# Patient Record
Sex: Female | Born: 1965 | Race: White | Hispanic: No | Marital: Married | Smoking: Never smoker
Health system: Southern US, Community
[De-identification: ages and names within clinical notes are randomized; demographics above are authoritative.]

## PROBLEM LIST (undated history)

## (undated) DIAGNOSIS — N7011 Chronic salpingitis: Secondary | ICD-10-CM

## (undated) DIAGNOSIS — R7309 Other abnormal glucose: Secondary | ICD-10-CM

## (undated) HISTORY — DX: Chronic salpingitis: N70.11

## (undated) HISTORY — DX: Other abnormal glucose: R73.09

---

## 1998-09-24 ENCOUNTER — Other Ambulatory Visit: Admission: RE | Admit: 1998-09-24 | Discharge: 1998-09-24 | Payer: Self-pay | Admitting: Obstetrics and Gynecology

## 1999-10-14 ENCOUNTER — Other Ambulatory Visit: Admission: RE | Admit: 1999-10-14 | Discharge: 1999-10-14 | Payer: Self-pay | Admitting: Obstetrics and Gynecology

## 2000-11-10 ENCOUNTER — Other Ambulatory Visit: Admission: RE | Admit: 2000-11-10 | Discharge: 2000-11-10 | Payer: Self-pay | Admitting: Obstetrics and Gynecology

## 2001-07-21 HISTORY — PX: LAPAROSCOPIC CHOLECYSTECTOMY: SUR755

## 2001-11-09 ENCOUNTER — Other Ambulatory Visit: Admission: RE | Admit: 2001-11-09 | Discharge: 2001-11-09 | Payer: Self-pay | Admitting: Obstetrics and Gynecology

## 2002-11-15 ENCOUNTER — Other Ambulatory Visit: Admission: RE | Admit: 2002-11-15 | Discharge: 2002-11-15 | Payer: Self-pay | Admitting: Obstetrics and Gynecology

## 2003-11-17 ENCOUNTER — Other Ambulatory Visit: Admission: RE | Admit: 2003-11-17 | Discharge: 2003-11-17 | Payer: Self-pay | Admitting: Obstetrics and Gynecology

## 2004-07-21 HISTORY — PX: BREAST BIOPSY: SHX20

## 2004-09-05 ENCOUNTER — Ambulatory Visit: Payer: Self-pay | Admitting: Family Medicine

## 2004-12-25 ENCOUNTER — Ambulatory Visit (HOSPITAL_COMMUNITY): Admission: RE | Admit: 2004-12-25 | Discharge: 2004-12-25 | Payer: Self-pay | Admitting: Obstetrics and Gynecology

## 2004-12-25 ENCOUNTER — Other Ambulatory Visit: Admission: RE | Admit: 2004-12-25 | Discharge: 2004-12-25 | Payer: Self-pay | Admitting: Obstetrics and Gynecology

## 2005-01-01 ENCOUNTER — Encounter: Admission: RE | Admit: 2005-01-01 | Discharge: 2005-01-01 | Payer: Self-pay | Admitting: Obstetrics and Gynecology

## 2005-01-02 ENCOUNTER — Encounter (INDEPENDENT_AMBULATORY_CARE_PROVIDER_SITE_OTHER): Payer: Self-pay | Admitting: Specialist

## 2005-01-02 ENCOUNTER — Encounter: Admission: RE | Admit: 2005-01-02 | Discharge: 2005-01-02 | Payer: Self-pay | Admitting: Obstetrics and Gynecology

## 2006-02-03 ENCOUNTER — Encounter: Admission: RE | Admit: 2006-02-03 | Discharge: 2006-02-03 | Payer: Self-pay | Admitting: Obstetrics and Gynecology

## 2006-02-18 ENCOUNTER — Other Ambulatory Visit: Admission: RE | Admit: 2006-02-18 | Discharge: 2006-02-18 | Payer: Self-pay | Admitting: Obstetrics and Gynecology

## 2007-07-06 ENCOUNTER — Other Ambulatory Visit: Admission: RE | Admit: 2007-07-06 | Discharge: 2007-07-06 | Payer: Self-pay | Admitting: Obstetrics and Gynecology

## 2007-07-29 ENCOUNTER — Encounter: Admission: RE | Admit: 2007-07-29 | Discharge: 2007-07-29 | Payer: Self-pay | Admitting: Obstetrics and Gynecology

## 2008-07-27 ENCOUNTER — Other Ambulatory Visit: Admission: RE | Admit: 2008-07-27 | Discharge: 2008-07-27 | Payer: Self-pay | Admitting: Obstetrics & Gynecology

## 2009-05-30 ENCOUNTER — Encounter: Admission: RE | Admit: 2009-05-30 | Discharge: 2009-05-30 | Payer: Self-pay | Admitting: Obstetrics and Gynecology

## 2010-08-16 ENCOUNTER — Other Ambulatory Visit: Payer: Self-pay | Admitting: Obstetrics and Gynecology

## 2010-08-16 DIAGNOSIS — Z1239 Encounter for other screening for malignant neoplasm of breast: Secondary | ICD-10-CM

## 2010-09-09 ENCOUNTER — Ambulatory Visit: Payer: Self-pay

## 2010-09-17 ENCOUNTER — Ambulatory Visit
Admission: RE | Admit: 2010-09-17 | Discharge: 2010-09-17 | Disposition: A | Discharge status: Home / Self Care | Payer: BC Managed Care – PPO | Source: Ambulatory Visit | Attending: Obstetrics and Gynecology | Admitting: Obstetrics and Gynecology

## 2010-09-17 DIAGNOSIS — Z1239 Encounter for other screening for malignant neoplasm of breast: Secondary | ICD-10-CM

## 2013-09-13 ENCOUNTER — Encounter: Payer: Self-pay | Admitting: Certified Nurse Midwife

## 2013-09-20 ENCOUNTER — Ambulatory Visit: Payer: Self-pay | Admitting: Certified Nurse Midwife

## 2013-09-26 ENCOUNTER — Ambulatory Visit (INDEPENDENT_AMBULATORY_CARE_PROVIDER_SITE_OTHER): Payer: BC Managed Care – PPO | Admitting: Certified Nurse Midwife

## 2013-09-26 ENCOUNTER — Encounter: Payer: Self-pay | Admitting: Certified Nurse Midwife

## 2013-09-26 VITALS — BP 130/84 | HR 80 | Resp 20 | Ht 67.75 in | Wt 220.0 lb

## 2013-09-26 DIAGNOSIS — Z0000 Encounter for general adult medical examination without abnormal findings: Secondary | ICD-10-CM

## 2013-09-26 DIAGNOSIS — Z01419 Encounter for gynecological examination (general) (routine) without abnormal findings: Secondary | ICD-10-CM

## 2013-09-26 DIAGNOSIS — Z309 Encounter for contraceptive management, unspecified: Secondary | ICD-10-CM

## 2013-09-26 LAB — HEMOGLOBIN, FINGERSTICK: HEMOGLOBIN, FINGERSTICK: 12.8 g/dL (ref 12.0–16.0)

## 2013-09-26 LAB — POCT URINALYSIS DIPSTICK
BILIRUBIN UA: NEGATIVE
Blood, UA: NEGATIVE
Glucose, UA: NEGATIVE
KETONES UA: NEGATIVE
Leukocytes, UA: NEGATIVE
Nitrite, UA: NEGATIVE
PH UA: 5
Protein, UA: NEGATIVE
Urobilinogen, UA: NEGATIVE

## 2013-09-26 LAB — LIPID PANEL
CHOLESTEROL: 161 mg/dL (ref 0–200)
HDL: 63 mg/dL (ref >39)
LDL CALC: 68 mg/dL (ref 0–99)
Total CHOL/HDL Ratio: 2.6 Ratio
Triglycerides: 151 mg/dL — ABNORMAL HIGH (ref <150)
VLDL: 30 mg/dL (ref 0–40)

## 2013-09-26 MED ORDER — DESOGESTREL-ETHINYL ESTRADIOL 0.15-30 MG-MCG PO TABS: 1.0000 | ORAL_TABLET | Freq: Every day | ORAL | Status: DC

## 2013-09-26 NOTE — Progress Notes (Signed)
48 y.o. G70P2002 Married Caucasian Fe here for annual exam. Periods normal, no issues. Contraception working well. No health issues this year. Employment change closer to home, working well. [employer] in college!  Patient's last menstrual period was 09/01/2013.          Sexually active: yes  The current method of family planning is oral estrogen/ progesterone contraceptive.    Exercising: no  exercise Smoker:  no  Health Maintenance: Pap:  09-15-12 neg HPV HR neg MMG:  09-17-10 neg Colonoscopy:  none BMD:   none TDaP:  2012 Labs: Poct urine-neg, Hgb-12.8 Self breast exam: done occ   reports that she has never smoked. She does not have any smokeless tobacco history on file. She reports that she does not drink alcohol or use illicit drugs.  Past Medical History  Diagnosis Date  . Hydrosalpinx     right    Past Surgical History  Procedure Laterality Date  . Laparoscopic cholecystectomy  2003    Current Outpatient Prescriptions  Medication Sig Dispense Refill  . desogestrel-ethinyl estradiol (APRI,EMOQUETTE,SOLIA) 0.15-30 MG-MCG tablet Take 1 tablet by mouth daily.      . Multiple Vitamins-Minerals (MULTIVITAMIN PO) Take by mouth daily.       No current facility-administered medications for this visit.    Family History  Problem Relation Age of Onset  . Hypertension Mother   . Kidney failure Mother     ROS:  Pertinent items are noted in HPI.  Otherwise, a comprehensive ROS was negative.  Exam:   BP 130/84  Pulse 80  Resp 20  Ht 5' 7.75" (1.721 m)  Wt 220 lb (99.791 kg)  BMI 33.69 kg/m2  LMP 09/01/2013 Height: 5' 7.75" (172.1 cm)  Ht Readings from Last 3 Encounters:  09/26/13 5' 7.75" (1.721 m)    Re check of BP 120/78 after exam General appearance: alert, cooperative and appears stated age Head: Normocephalic, without obvious abnormality, atraumatic Neck: no adenopathy, supple, symmetrical, trachea midline and thyroid normal to inspection and palpation and  non-palpable Lungs: clear to auscultation bilaterally Breasts: normal appearance, no masses or tenderness, No nipple retraction or dimpling, No nipple discharge or bleeding, No axillary or supraclavicular adenopathy Heart: regular rate and rhythm Abdomen: soft, non-tender; no masses,  no organomegaly Extremities: extremities normal, atraumatic, no cyanosis or edema Skin: Skin color, texture, turgor normal. No rashes or lesions Lymph nodes: Cervical, supraclavicular, and axillary nodes normal. No abnormal inguinal nodes palpated Neurologic: Grossly normal   Pelvic: External genitalia:  no lesions              Urethra:  normal appearing urethra with no masses, tenderness or lesions              Bartholin's and Skene's: normal                 Vagina: normal appearing vagina with normal color and discharge, no lesions              Cervix: normal, non tender              Pap taken: no Bimanual Exam:  Uterus:  normal size, contour, position, consistency, mobility, non-tender and anteflexed              Adnexa: normal adnexa and no mass, fullness, tenderness               Rectovaginal: Confirms               Anus:  normal  sphincter tone, no lesions  A:  Well Woman with normal exam  Contraception OCP desired  Fasting labs  P:   Reviewed health and wellness pertinent to exam  Rx Apri see order  Lab: Lipid panel, TSH  Pap smear as per guidelines   Mammogram yearly pap smear not taken today  counseled on breast self exam, mammography screening, adequate intake of calcium and vitamin D, diet, weight loss and exercise, colonoscopy recomended at age 48, will advise if desires this year.  return annually or prn  An After Visit Summary was printed and given to the patient.

## 2013-09-26 NOTE — Progress Notes (Signed)
Reviewed personally.  M. Suzanne Adiba Fargnoli, MD.  

## 2013-09-26 NOTE — Patient Instructions (Signed)

## 2013-09-27 ENCOUNTER — Telehealth: Payer: Self-pay

## 2013-09-27 LAB — TSH: TSH: 1.792 u[IU]/mL (ref 0.350–4.500)

## 2013-09-27 NOTE — Telephone Encounter (Signed)
Message copied by Eliezer BottomJOHNSON, Christon Gallaway J on Tue Sep 27, 2013 11:09 AM ------      Message from: Verner CholLEONARD, DEBORAH S      Created: Tue Sep 27, 2013  8:18 AM       Notify patient TSH normal and Lipid panel great Cholesterol 161 and HDL protective cholesterol 63, Hgb. 12.8 ------

## 2013-09-27 NOTE — Telephone Encounter (Signed)
Patient notified of results. See lab 

## 2013-09-27 NOTE — Telephone Encounter (Signed)
lmtcb

## 2014-05-22 ENCOUNTER — Encounter: Payer: Self-pay | Admitting: Certified Nurse Midwife

## 2014-09-05 ENCOUNTER — Other Ambulatory Visit: Payer: Self-pay | Admitting: Certified Nurse Midwife

## 2014-09-05 NOTE — Telephone Encounter (Signed)
Medication refill request: Apri  Last AEX:  09/26/13 with DL Next AEX: 1/61/093/15/16 with DL Last MMG (if hormonal medication request): 09/18/10 Bi-Rads 1: Negative Refill authorized: #[id number] rfs, please advise.

## 2014-09-05 NOTE — Telephone Encounter (Signed)
Ok to refill to appointment

## 2014-10-03 ENCOUNTER — Ambulatory Visit (INDEPENDENT_AMBULATORY_CARE_PROVIDER_SITE_OTHER): Payer: BLUE CROSS/BLUE SHIELD | Admitting: Certified Nurse Midwife

## 2014-10-03 ENCOUNTER — Ambulatory Visit
Admission: RE | Admit: 2014-10-03 | Discharge: 2014-10-03 | Disposition: A | Discharge status: Home / Self Care | Payer: BLUE CROSS/BLUE SHIELD | Source: Ambulatory Visit | Attending: Certified Nurse Midwife | Admitting: Certified Nurse Midwife

## 2014-10-03 ENCOUNTER — Encounter: Payer: Self-pay | Admitting: Certified Nurse Midwife

## 2014-10-03 ENCOUNTER — Other Ambulatory Visit: Payer: Self-pay | Admitting: Certified Nurse Midwife

## 2014-10-03 VITALS — BP 120/78 | HR 70 | Resp 16 | Ht 67.5 in | Wt 223.0 lb

## 2014-10-03 DIAGNOSIS — Z0000 Encounter for general adult medical examination without abnormal findings: Secondary | ICD-10-CM | POA: Diagnosis not present

## 2014-10-03 DIAGNOSIS — Z124 Encounter for screening for malignant neoplasm of cervix: Secondary | ICD-10-CM

## 2014-10-03 DIAGNOSIS — Z01419 Encounter for gynecological examination (general) (routine) without abnormal findings: Secondary | ICD-10-CM

## 2014-10-03 DIAGNOSIS — Z3041 Encounter for surveillance of contraceptive pills: Secondary | ICD-10-CM | POA: Diagnosis not present

## 2014-10-03 DIAGNOSIS — N63 Unspecified lump in breast: Secondary | ICD-10-CM

## 2014-10-03 DIAGNOSIS — Z1211 Encounter for screening for malignant neoplasm of colon: Secondary | ICD-10-CM

## 2014-10-03 DIAGNOSIS — N6320 Unspecified lump in the left breast, unspecified quadrant: Secondary | ICD-10-CM

## 2014-10-03 DIAGNOSIS — N6321 Unspecified lump in the left breast, upper outer quadrant: Secondary | ICD-10-CM

## 2014-10-03 LAB — CBC
HEMATOCRIT: 38 % (ref 36.0–46.0)
Hemoglobin: 11.8 g/dL — ABNORMAL LOW (ref 12.0–15.0)
MCH: 24.3 pg — ABNORMAL LOW (ref 26.0–34.0)
MCHC: 31.1 g/dL (ref 30.0–36.0)
MCV: 78.4 fL (ref 78.0–100.0)
MPV: 8.7 fL (ref 8.6–12.4)
PLATELETS: 458 10*3/uL — AB (ref 150–400)
RBC: 4.85 MIL/uL (ref 3.87–5.11)
RDW: 15 % (ref 11.5–15.5)
WBC: 10.8 10*3/uL — ABNORMAL HIGH (ref 4.0–10.5)

## 2014-10-03 LAB — IBC PANEL
%SAT: 9 % — AB (ref 20–55)
TIBC: 576 ug/dL — ABNORMAL HIGH (ref 250–470)
UIBC: 523 ug/dL — ABNORMAL HIGH (ref 125–400)

## 2014-10-03 LAB — IRON: Iron: 53 ug/dL (ref 42–145)

## 2014-10-03 LAB — FERRITIN: FERRITIN: 6 ng/mL — AB (ref 10–291)

## 2014-10-03 MED ORDER — NORETHIN ACE-ETH ESTRAD-FE 1-20 MG-MCG(24) PO TABS: 1.0000 | ORAL_TABLET | Freq: Every day | ORAL | Status: DC

## 2014-10-03 NOTE — Progress Notes (Signed)
49 y.o. G75P2002 Married  Caucasian Fe here for annual exam. Periods normal no issues, except for day 1 heavy and then only 2 days until completed. "Really wipes me out". Eating good diet with iron foods and drinks adequate water. Contraception working well, but aware she will need to decrease dosage this year and is fine with a change. Has just started last pack and will complete and then switch. Denies any hot flashes or mood changes. Still has not had her mammogram!!! Sees Dr. Sol Passer prn, last labs here. No other concerns today or health issues.  Patient's last menstrual period was 09/27/2014.          Sexually active: Yes.    The current method of family planning is OCP (estrogen/progesterone).    Exercising: No.  exercise Smoker:  no  Health Maintenance: Pap:  09-15-12 neg HPV HR neg MMG: 09-17-12 neg birads 1:neg Colonoscopy: none BMD:   none TDaP:  2012 Labs: Hgb-11.2 Self breast exam: done monthly   reports that she has never smoked. She does not have any smokeless tobacco history on file. She reports that she does not drink alcohol or use illicit drugs.  Past Medical History  Diagnosis Date  . Hydrosalpinx     right    Past Surgical History  Procedure Laterality Date  . Laparoscopic cholecystectomy  2003    Current Outpatient Prescriptions  Medication Sig Dispense Refill  . APRI 0.15-30 MG-MCG tablet TAKE 1 TABLET BY MOUTH DAILY. 84 tablet 0  . Multiple Vitamins-Minerals (MULTIVITAMIN PO) Take by mouth daily.     No current facility-administered medications for this visit.    Family History  Problem Relation Age of Onset  . Hypertension Mother   . Kidney failure Mother     ROS:  Pertinent items are noted in HPI.  Otherwise, a comprehensive ROS was negative.  Exam:   Ht 5' 7.5" (1.715 m)  Wt 223 lb (101.152 kg)  BMI 34.39 kg/m2  LMP 09/27/2014 Height: 5' 7.5" (171.5 cm) Ht Readings from Last 3 Encounters:  10/03/14 5' 7.5" (1.715 m)  09/26/13 5' 7.75" (1.721  m)    General appearance: alert, cooperative and appears stated age Head: Normocephalic, without obvious abnormality, atraumatic Neck: no adenopathy, supple, symmetrical, trachea midline and thyroid normal to inspection and palpation Lungs: clear to auscultation bilaterally Breasts: normal appearance, no masses or tenderness, No nipple retraction or dimpling, No nipple discharge or bleeding, No axillary or supraclavicular adenopathy, positive findings: left breast 1-2 cm mobile cystic feel mass at 2 pm 2 FB out from aerola non tender Heart: regular rate and rhythm Abdomen: soft, non-tender; no masses,  no organomegaly Extremities: extremities normal, atraumatic, no cyanosis or edema Skin: Skin color, texture, turgor normal. No rashes or lesions Lymph nodes: Cervical, supraclavicular, and axillary nodes normal. No abnormal inguinal nodes palpated Neurologic: Grossly normal   Pelvic: External genitalia:  no lesions              Urethra:  normal appearing urethra with no masses, tenderness or lesions              Bartholin's and Skene's: normal                 Vagina: normal appearing vagina with normal color and discharge, no lesions              Cervix: normal,non tender, no lesions              Pap taken: Yes.  Bimanual Exam:  Uterus:  normal size, contour, position, consistency, mobility, non-tender and mid position              Adnexa: normal adnexa and no mass, fullness, tenderness               Rectovaginal: Confirms               Anus:  normal sphincter tone, no lesions  Chaperone present: Yes  A:  Well Woman with normal exam  Contraception OCP will decrease dosage due to age  Perimenopausal with cycle change  Left breast mass  R/O anemia  Colonoscopy due  P:   Reviewed health and wellness pertinent to exam  Discussed OCP change and will need condom use during first month due to decrease in estrogen amount. Discussed 3 month adjustment period and to call if problems.  Rx  Loestrin 1/20 Fe see order  Discussed perimenopausal etiology expectations and bleeding profile. Given menstrual calendar with normal and abnormal parameters to advise if occurs. Questions addresssed.  Discussed finding and need for diagnostic mammogram and us for evaluation. Patient agreeable. Will schedule while patient is here today. Needs screening on right breast also.  Discussed HGB has decreased from her normal >12 and may be related to last heavy menses. Work on iron foods in diet.  Labs Iron, CBC,Ferritin , IBC  Discussed risks and benefits of colonoscopy, patient desires referral for scheduling. She will be called with referral information. Questions addressed.  Pap smear taken today with HPV reflex   counseled on breast self exam, mammography screening, menopause, adequate intake of calcium and vitamin D, diet and exercise  return annually or prn  An After Visit Summary was printed and given to the patient.

## 2014-10-03 NOTE — Patient Instructions (Signed)
EXERCISE AND DIET:  We recommended that you start or continue a regular exercise program for good health. Regular exercise means any activity that makes your heart beat faster and makes you sweat.  We recommend exercising at least 30 minutes per day at least 3 days a week, preferably 4 or 5.  We also recommend a diet low in fat and sugar.  Inactivity, poor dietary choices and obesity can cause diabetes, heart attack, stroke, and kidney damage, among others.    ALCOHOL AND SMOKING:  Women should limit their alcohol intake to no more than 7 drinks/beers/glasses of wine (combined, not each!) per week. Moderation of alcohol intake to this level decreases your risk of breast cancer and liver damage. And of course, no recreational drugs are part of a healthy lifestyle.  And absolutely no smoking or even second hand smoke. Most people know smoking can cause heart and lung diseases, but did you know it also contributes to weakening of your bones? Aging of your skin?  Yellowing of your teeth and nails?  CALCIUM AND VITAMIN D:  Adequate intake of calcium and Vitamin D are recommended.  The recommendations for exact amounts of these supplements seem to change often, but generally speaking 600 mg of calcium (either carbonate or citrate) and 800 units of Vitamin D per day seems prudent. Certain women may benefit from higher intake of Vitamin D.  If you are among these women, your doctor will have told you during your visit.    PAP SMEARS:  Pap smears, to check for cervical cancer or precancers,  have traditionally been done yearly, although recent scientific advances have shown that most women can have pap smears less often.  However, every woman still should have a physical exam from her gynecologist every year. It will include a breast check, inspection of the vulva and vagina to check for abnormal growths or skin changes, a visual exam of the cervix, and then an exam to evaluate the size and shape of the uterus and  ovaries.  And after 51 years of age, a rectal exam is indicated to check for rectal cancers. We will also provide age appropriate advice regarding health maintenance, like when you should have certain vaccines, screening for sexually transmitted diseases, bone density testing, colonoscopy, mammograms, etc.   MAMMOGRAMS:  All women over 40 years old should have a yearly mammogram. Many facilities now offer a "3D" mammogram, which may cost around $50 extra out of pocket. If possible,  we recommend you accept the option to have the 3D mammogram performed.  It both reduces the number of women who will be called back for extra views which then turn out to be normal, and it is better than the routine mammogram at detecting truly abnormal areas.    COLONOSCOPY:  Colonoscopy to screen for colon cancer is recommended for all women at age 50.  We know, you hate the idea of the prep.  We agree, BUT, having colon cancer and not knowing it is worse!!  Colon cancer so often starts as a polyp that can be seen and removed at colonscopy, which can quite literally save your life!  And if your first colonoscopy is normal and you have no family history of colon cancer, most women don't have to have it again for 10 years.  Once every ten years, you can do something that may end up saving your life, right?  We will be happy to help you get it scheduled when you are ready.    Be sure to check your insurance coverage so you understand how much it will cost.  It may be covered as a preventative service at no cost, but you should check your particular policy.     Perimenopause Perimenopause is the time when your body begins to move into the menopause (no menstrual period for 12 straight months). It is a natural process. Perimenopause can begin 2-8 years before the menopause and usually lasts for 1 year after the menopause. During this time, your ovaries may or may not produce an egg. The ovaries vary in their production of estrogen and  progesterone hormones each month. This can cause irregular menstrual periods, difficulty getting pregnant, vaginal bleeding between periods, and uncomfortable symptoms. CAUSES  Irregular production of the ovarian hormones, estrogen and progesterone, and not ovulating every month.  Other causes include:  Tumor of the pituitary gland in the brain.  Medical disease that affects the ovaries.  Radiation treatment.  Chemotherapy.  Unknown causes.  Heavy smoking and excessive alcohol intake can bring on perimenopause sooner. SIGNS AND SYMPTOMS   Hot flashes.  Night sweats.  Irregular menstrual periods.  Decreased sex drive.  Vaginal dryness.  Headaches.  Mood swings.  Depression.  Memory problems.  Irritability.  Tiredness.  Weight gain.  Trouble getting pregnant.  The beginning of losing bone cells (osteoporosis).  The beginning of hardening of the arteries (atherosclerosis). DIAGNOSIS  Your health care provider will make a diagnosis by analyzing your age, menstrual history, and symptoms. He or she will do a physical exam and note any changes in your body, especially your female organs. Female hormone tests may or may not be helpful depending on the amount of female hormones you produce and when you produce them. However, other hormone tests may be helpful to rule out other problems. TREATMENT  In some cases, no treatment is needed. The decision on whether treatment is necessary during the perimenopause should be made by you and your health care provider based on how the symptoms are affecting you and your lifestyle. Various treatments are available, such as:  Treating individual symptoms with a specific medicine for that symptom.  Herbal medicines that can help specific symptoms.  Counseling.  Group therapy. HOME CARE INSTRUCTIONS   Keep track of your menstrual periods (when they occur, how heavy they are, how long between periods, and how long they last) as  well as your symptoms and when they started.  Only take over-the-counter or prescription medicines as directed by your health care provider.  Sleep and rest.  Exercise.  Eat a diet that contains calcium (good for your bones) and soy (acts like the estrogen hormone).  Do not smoke.  Avoid alcoholic beverages.  Take vitamin supplements as recommended by your health care provider. Taking vitamin E may help in certain cases.  Take calcium and vitamin D supplements to help prevent bone loss.  Group therapy is sometimes helpful.  Acupuncture may help in some cases. SEEK MEDICAL CARE IF:   You have questions about any symptoms you are having.  You need a referral to a specialist (gynecologist, psychiatrist, or psychologist). SEEK IMMEDIATE MEDICAL CARE IF:   You have vaginal bleeding.  Your period lasts longer than 8 days.  Your periods are recurring sooner than 21 days.  You have bleeding after intercourse.  You have severe depression.  You have pain when you urinate.  You have severe headaches.  You have vision problems. Document Released: 08/14/2004 Document Revised: 04/27/2013 Document Reviewed: 02/03/2013 [employer]   Patient Information 2015 [employer]. This information is not intended to replace advice given to you by your health care provider. Make sure you discuss any questions you have with your health care provider.  

## 2014-10-03 NOTE — Progress Notes (Signed)
Late entry. Patient scheduled for bilateral diagnostic mammogram [facility] while in the office. Appointment scheduled for today 10/03/2014 at 3:30pm. Patient is agreeable to date and time.

## 2014-10-04 ENCOUNTER — Other Ambulatory Visit: Payer: Self-pay | Admitting: Certified Nurse Midwife

## 2014-10-04 ENCOUNTER — Telehealth: Payer: Self-pay

## 2014-10-04 DIAGNOSIS — D649 Anemia, unspecified: Secondary | ICD-10-CM

## 2014-10-04 LAB — IPS PAP TEST WITH REFLEX TO HPV

## 2014-10-04 LAB — HEMOGLOBIN, FINGERSTICK: Hemoglobin, fingerstick: 11.2 g/dL — ABNORMAL LOW (ref 12.0–16.0)

## 2014-10-04 NOTE — Telephone Encounter (Signed)
Left detailed message per DPR. Hold to see if patient schedules appt

## 2014-10-04 NOTE — Progress Notes (Signed)
Reviewed personally.  M. Suzanne Joziah Dollins, MD.  

## 2014-10-04 NOTE — Telephone Encounter (Signed)
-----   Message from Verner Choleborah S Leonard, CNM sent at 10/04/2014  7:40 AM EDT ----- Notify patient that CBC showed borderline low hemoglobin with increase in platelet number which occurs with anemia Ferritin level is  Low at 6 normal is 10-291 Iron saturation level is low at 9 normal is 20-55 She has borderline anemia. She needs to start on OTC Slow release iron one daily at hs with Vitamin C source for better absorption. Also a tablespoon of Blackstrap molasses daily is a great iron source. Recheck levels in one month, please schedule order in

## 2014-10-16 ENCOUNTER — Telehealth: Payer: Self-pay | Admitting: Emergency Medicine

## 2014-10-16 NOTE — Telephone Encounter (Signed)
Spoke with patient and message from Deborah S. Leonard CNM given.  She isVerner Tyler agreeable to recheck of breast and the appointment is scheduled for 12/28/14 at 0915. Patient states she is feeling less fatigued with new iron rx and would like to wait until this time to have iron levels rechecked with Debra Choleborah S. Leonard CNM. Patient states she lives in [place] and doesn't want to drive back and fourth to [place] if not necessary. Added repeat blood work to appointment notes. I advised I would notify Debra Choleborah S. Leonard CNM of her preferences.   Okay as scheduled?

## 2014-10-16 NOTE — Telephone Encounter (Signed)
Notes Recorded by Verner Choleborah S Leonard, CNM on 10/03/2014 at 5:18 PM Notify patient that reviewed mammogram and US results and fibroglandular tissue noted. Would like to recheck breast in 2 months. Continue SBE and report if she notes any other concerns. Repeat mammogram in one year.  Birads 1 negative Density C needs 3 D yearly

## 2014-10-16 NOTE — Telephone Encounter (Signed)
-----   Message from Jerene BearsMary S Miller, MD sent at 10/05/2014  3:05 PM EDT ----- Out of mmg hold.  Agreed.

## 2014-10-17 NOTE — Telephone Encounter (Signed)
Noted, will close encounter. 

## 2014-10-17 NOTE — Telephone Encounter (Signed)
Pt scheduled  

## 2014-10-17 NOTE — Telephone Encounter (Signed)
Yes ok to do at same visit

## 2014-12-28 ENCOUNTER — Encounter: Payer: Self-pay | Admitting: Certified Nurse Midwife

## 2014-12-28 ENCOUNTER — Ambulatory Visit (INDEPENDENT_AMBULATORY_CARE_PROVIDER_SITE_OTHER): Payer: BLUE CROSS/BLUE SHIELD | Admitting: Certified Nurse Midwife

## 2014-12-28 VITALS — BP 124/82 | HR 72 | Resp 20 | Ht 67.5 in | Wt 230.0 lb

## 2014-12-28 DIAGNOSIS — N644 Mastodynia: Secondary | ICD-10-CM

## 2014-12-28 DIAGNOSIS — D649 Anemia, unspecified: Secondary | ICD-10-CM

## 2014-12-28 LAB — IBC PANEL
%SAT: 8 % — AB (ref 20–55)
TIBC: 543 ug/dL — ABNORMAL HIGH (ref 250–470)
UIBC: 502 ug/dL — ABNORMAL HIGH (ref 125–400)

## 2014-12-28 LAB — CBC
HCT: 38.6 % (ref 36.0–46.0)
Hemoglobin: 12.4 g/dL (ref 12.0–15.0)
MCH: 26.1 pg (ref 26.0–34.0)
MCHC: 32.1 g/dL (ref 30.0–36.0)
MCV: 81.3 fL (ref 78.0–100.0)
MPV: 8.8 fL (ref 8.6–12.4)
PLATELETS: 419 10*3/uL — AB (ref 150–400)
RBC: 4.75 MIL/uL (ref 3.87–5.11)
RDW: 16.6 % — AB (ref 11.5–15.5)
WBC: 9.3 10*3/uL (ref 4.0–10.5)

## 2014-12-28 LAB — IRON: IRON: 41 ug/dL — AB (ref 42–145)

## 2014-12-28 LAB — FERRITIN: Ferritin: 9 ng/mL — ABNORMAL LOW (ref 10–291)

## 2014-12-28 NOTE — Progress Notes (Signed)
   Subjective:   49 y.o. MarriedCaucasian female presents for follow up of breast tenderness noted on aex 10/03/14. Patient had normal diagnostic mammogram and [place]. Doing self breast exam and has not noted any change. She has increase water an dless caffeine and plan to decrease underwire bra use, which seems to increase tenderness at menses. Denies any skin change or nipple discharge. Last mammogram was 10/03/14  Also here for lab recheck for abnormal ferritin and platelet level. Had EGD and colonoscopy which were negative. Not currently taking Slow Fe. No other health issues today. Review of Systems Pertinent items are noted in HPI.   Objective:   General appearance: alert, cooperative, appears stated age and no distress Skin: normal color no palor Breasts: normal appearance, no masses or tenderness, No nipple retraction or dimpling, No nipple discharge or bleeding, No axillary or supraclavicular adenopathy, no tenderness noted in outer edge of breast or mass palpated in right or left brease    Assessment:   ASSESSMENT:Patient is diagnosed with breast tenderness probable related to bra or caffeine use, premenstrual tenderness monthly. Does SBE.Marland Kitchen  Normal mammogram and [place] with class C density and fibroglandular changes. Low Ferritin level and elevated platelets Normal EGD and colonoscopy Plan:   Plan: Discussed mammogram and US findings and no change in breast exam today. Discussed normal changes with perimenopause with breast and tenderness premenstrual normal. Agree with continue to decrease caffeine and underwire bra change. Patient will continue SBE and call if any change. Recheck at aex 3/17 unless change. Labs;CBC Ferritin, IBC, iron already order in labs prior than today visit. Will advise with iron supplement when lab in. Continue to work iron foods in diet. Questions addressed.   Rv prn

## 2014-12-28 NOTE — Patient Instructions (Signed)

## 2014-12-29 ENCOUNTER — Other Ambulatory Visit: Payer: Self-pay | Admitting: Certified Nurse Midwife

## 2014-12-29 DIAGNOSIS — D509 Iron deficiency anemia, unspecified: Secondary | ICD-10-CM

## 2014-12-31 NOTE — Progress Notes (Signed)
Reviewed personally.  M. Suzanne Vincentina Sollers, MD.  

## 2015-04-02 ENCOUNTER — Other Ambulatory Visit (INDEPENDENT_AMBULATORY_CARE_PROVIDER_SITE_OTHER): Payer: BLUE CROSS/BLUE SHIELD

## 2015-04-02 DIAGNOSIS — D509 Iron deficiency anemia, unspecified: Secondary | ICD-10-CM

## 2015-04-02 LAB — IRON: IRON: 107 ug/dL (ref 45–160)

## 2015-04-02 LAB — CBC
HCT: 39.3 % (ref 36.0–46.0)
Hemoglobin: 13.5 g/dL (ref 12.0–15.0)
MCH: 28.7 pg (ref 26.0–34.0)
MCHC: 34.4 g/dL (ref 30.0–36.0)
MCV: 83.6 fL (ref 78.0–100.0)
MPV: 9 fL (ref 8.6–12.4)
PLATELETS: 398 10*3/uL (ref 150–400)
RBC: 4.7 MIL/uL (ref 3.87–5.11)
RDW: 15.2 % (ref 11.5–15.5)
WBC: 9 10*3/uL (ref 4.0–10.5)

## 2015-04-02 LAB — IBC PANEL
%SAT: 22 % (ref 11–50)
TIBC: 492 ug/dL — ABNORMAL HIGH (ref 250–450)
UIBC: 385 ug/dL (ref 125–400)

## 2015-04-02 LAB — FERRITIN: Ferritin: 26 ng/mL (ref 10–291)

## 2015-10-03 ENCOUNTER — Other Ambulatory Visit: Payer: Self-pay

## 2015-10-03 ENCOUNTER — Other Ambulatory Visit: Payer: Self-pay | Admitting: Certified Nurse Midwife

## 2015-10-03 DIAGNOSIS — Z1231 Encounter for screening mammogram for malignant neoplasm of breast: Secondary | ICD-10-CM

## 2015-10-04 ENCOUNTER — Encounter: Payer: Self-pay | Admitting: Certified Nurse Midwife

## 2015-10-04 ENCOUNTER — Ambulatory Visit (INDEPENDENT_AMBULATORY_CARE_PROVIDER_SITE_OTHER): Payer: BLUE CROSS/BLUE SHIELD | Admitting: Certified Nurse Midwife

## 2015-10-04 ENCOUNTER — Other Ambulatory Visit: Payer: Self-pay | Admitting: Certified Nurse Midwife

## 2015-10-04 VITALS — BP 130/80 | HR 72 | Resp 16 | Ht 67.75 in | Wt 233.0 lb

## 2015-10-04 DIAGNOSIS — Z0000 Encounter for general adult medical examination without abnormal findings: Secondary | ICD-10-CM | POA: Diagnosis not present

## 2015-10-04 DIAGNOSIS — N912 Amenorrhea, unspecified: Secondary | ICD-10-CM

## 2015-10-04 DIAGNOSIS — N951 Menopausal and female climacteric states: Secondary | ICD-10-CM | POA: Diagnosis not present

## 2015-10-04 DIAGNOSIS — Z01419 Encounter for gynecological examination (general) (routine) without abnormal findings: Secondary | ICD-10-CM

## 2015-10-04 LAB — LIPID PANEL
CHOLESTEROL: 200 mg/dL (ref 125–200)
HDL: 46 mg/dL (ref >=46)
LDL Cholesterol: 110 mg/dL (ref <130)
TRIGLYCERIDES: 222 mg/dL — AB (ref <150)
Total CHOL/HDL Ratio: 4.3 Ratio (ref <=5.0)
VLDL: 44 mg/dL — AB (ref <30)

## 2015-10-04 LAB — CBC
HCT: 41.7 % (ref 36.0–46.0)
HEMOGLOBIN: 13.7 g/dL (ref 12.0–15.0)
MCH: 28.2 pg (ref 26.0–34.0)
MCHC: 32.9 g/dL (ref 30.0–36.0)
MCV: 86 fL (ref 78.0–100.0)
MPV: 9.2 fL (ref 8.6–12.4)
Platelets: 393 10*3/uL (ref 150–400)
RBC: 4.85 MIL/uL (ref 3.87–5.11)
RDW: 13.8 % (ref 11.5–15.5)
WBC: 8.6 10*3/uL (ref 4.0–10.5)

## 2015-10-04 LAB — POCT URINALYSIS DIPSTICK
BILIRUBIN UA: NEGATIVE
Blood, UA: NEGATIVE
GLUCOSE UA: NEGATIVE
Ketones, UA: NEGATIVE
LEUKOCYTES UA: NEGATIVE
NITRITE UA: NEGATIVE
Protein, UA: NEGATIVE
UROBILINOGEN UA: NEGATIVE
pH, UA: 5

## 2015-10-04 LAB — COMPREHENSIVE METABOLIC PANEL
ALBUMIN: 4.3 g/dL (ref 3.6–5.1)
ALT: 15 U/L (ref 6–29)
AST: 24 U/L (ref 10–35)
Alkaline Phosphatase: 103 U/L (ref 33–130)
BILIRUBIN TOTAL: 0.5 mg/dL (ref 0.2–1.2)
BUN: 14 mg/dL (ref 7–25)
CALCIUM: 10.3 mg/dL (ref 8.6–10.4)
CHLORIDE: 97 mmol/L — AB (ref 98–110)
CO2: 27 mmol/L (ref 20–31)
CREATININE: 0.81 mg/dL (ref 0.50–1.05)
Glucose, Bld: 96 mg/dL (ref 65–99)
Potassium: 4.6 mmol/L (ref 3.5–5.3)
SODIUM: 138 mmol/L (ref 135–146)
TOTAL PROTEIN: 7.6 g/dL (ref 6.1–8.1)

## 2015-10-04 LAB — POCT URINE PREGNANCY: PREG TEST UR: NEGATIVE

## 2015-10-04 LAB — TSH: TSH: 1.77 mIU/L

## 2015-10-04 NOTE — Patient Instructions (Signed)

## 2015-10-04 NOTE — Progress Notes (Signed)
50 y.o. G60P2002 Married  Caucasian Fe here for annual exam. Periods stopped in 10/17. Some occasional hot flashes. Denies vaginal dryness or spotting. Stopped OCP last fall and no period since. Seen recently by MD for ear infection and treated with antibiotics, still like she has pressure in right ear only. Can we check? Having some joint pain in hands off and on. Sees PCP prn. Aware she has gained weight and is working on now. No other health issues today.  Patient's last menstrual period was 04/21/2015.          Sexually active: No.  The current method of family planning is abstinence. Stopped Loestrin. Exercising: No.  exercise Smoker:  no  Health Maintenance: Pap:  10-03-14 neg MMG:  10-03-14 mammo & left breast u/s category c density, birads 1:neg Colonoscopy: 5/16 f/u 21yrs BMD:   none TDaP:  2012 Shingles: no Pneumonia: no Hep C and HIV: not done Labs: poct urine-neg, upt-neg Self breast exam: done occ   reports that she has never smoked. She does not have any smokeless tobacco history on file. She reports that she does not drink alcohol or use illicit drugs.  Past Medical History  Diagnosis Date  . Hydrosalpinx     right    Past Surgical History  Procedure Laterality Date  . Laparoscopic cholecystectomy  2003    Current Outpatient Prescriptions  Medication Sig Dispense Refill  . Misc Natural Products (OSTEO BI-FLEX JOINT SHIELD PO) Take by mouth daily.    . Multiple Vitamins-Minerals (MULTIVITAMIN PO) Take by mouth daily.     No current facility-administered medications for this visit.    Family History  Problem Relation Age of Onset  . Hypertension Mother   . Kidney failure Mother     ROS:  Pertinent items are noted in HPI.  Otherwise, a comprehensive ROS was negative.  Exam:   BP 138/96 mmHg  Pulse 72  Resp 16  Ht 5' 7.75" (1.721 m)  Wt 233 lb (105.688 kg)  BMI 35.68 kg/m2  LMP 04/21/2015 Height: 5' 7.75" (172.1 cm) Ht Readings from Last 3 Encounters:   10/04/15 5' 7.75" (1.721 m)  12/28/14 5' 7.5" (1.715 m)  10/03/14 5' 7.5" (1.715 m)    General appearance: alert, cooperative and appears stated age Head: Normocephalic, without obvious abnormality, atraumatic Ears: right no redness, slight distention of membrane only, left ear no redness or membrane distention, no tenderness bilateral Neck: no adenopathy, supple, symmetrical, trachea midline and thyroid normal to inspection and palpation Lungs: clear to auscultation bilaterally Breasts: normal appearance, no masses or tenderness, No nipple retraction or dimpling, No nipple discharge or bleeding, No axillary or supraclavicular adenopathy Heart: regular rate and rhythm Abdomen: soft, non-tender; no masses,  no organomegaly Extremities: extremities normal, atraumatic, no cyanosis or edema Skin: Skin color, texture, turgor normal. No rashes or lesions Lymph nodes: Cervical, supraclavicular, and axillary nodes normal. No abnormal inguinal nodes palpated Neurologic: Grossly normal   Pelvic: External genitalia:  no lesions              Urethra:  normal appearing urethra with no masses, tenderness or lesions              Bartholin's and Skene's: normal                 Vagina: normal appearing vagina with normal color and discharge, no lesions              Cervix: normal,non tender,no lesions  Pap taken: No. Bimanual Exam:  Uterus:  normal size, contour, position, consistency, mobility, non-tender              Adnexa: normal adnexa and no mass, fullness, tenderness               Rectovaginal: Confirms               Anus:  normal sphincter tone, no lesions  Chaperone present: yes  A:  Well Woman with normal exam  Contraception abstinence  Perimenopausal symptoms with amenorrhea  Right ear distended membrane history of allergies.  Screening labs  Joint pain  Borderline BP today    P:   Reviewed health and wellness pertinent to exam  Discussed perimenopause/menopause and  etiology, expectations with bleeding and evaluation. Questions addressed. Recommend labs to evaluate along with her screening labs today. Patient agreeable.  Labs: FSH,TSH,Prolactin, Screening labs: CBC, Lipid panel, Vitamin D HIV, Hep.C, CBC  Discussed ear finding and recommended Mucinex trial to see if resolves. If no change needs to see PCP.  Discussed joint pain can occur from limited exercise and weight gain, as well as other issues. Encouraged regular exercise, increase water intake and decrease sodium intake. Will help with borderline BP changes. Patient will work on and will check BP randomly and advise if increasing. Questions addressed.    counseled on breast self exam, mammography screening, menopause, adequate intake of calcium and vitamin D, diet and exercise  return annually or prn  An After Visit Summary was printed and given to the patient.

## 2015-10-05 LAB — PROLACTIN: PROLACTIN: 7.5 ng/mL

## 2015-10-05 LAB — VITAMIN D 25 HYDROXY (VIT D DEFICIENCY, FRACTURES): Vit D, 25-Hydroxy: 41 ng/mL (ref 30–100)

## 2015-10-05 LAB — HEPATITIS C ANTIBODY: HCV AB: NEGATIVE

## 2015-10-05 LAB — FOLLICLE STIMULATING HORMONE: FSH: 73.1 m[IU]/mL

## 2015-10-05 LAB — HIV ANTIBODY (ROUTINE TESTING W REFLEX): HIV 1&2 Ab, 4th Generation: NONREACTIVE

## 2015-10-05 NOTE — Progress Notes (Signed)
Encounter reviewed Jill Jertson, MD   

## 2015-10-09 ENCOUNTER — Other Ambulatory Visit: Payer: Self-pay | Admitting: Certified Nurse Midwife

## 2015-10-09 DIAGNOSIS — N912 Amenorrhea, unspecified: Secondary | ICD-10-CM

## 2015-10-09 DIAGNOSIS — N951 Menopausal and female climacteric states: Secondary | ICD-10-CM

## 2015-10-09 DIAGNOSIS — Z0000 Encounter for general adult medical examination without abnormal findings: Secondary | ICD-10-CM

## 2015-10-10 LAB — HEMOGLOBIN A1C
HEMOGLOBIN A1C: 6.3 % — AB (ref <5.7)
Mean Plasma Glucose: 134 mg/dL — ABNORMAL HIGH (ref <117)

## 2015-10-18 ENCOUNTER — Ambulatory Visit (INDEPENDENT_AMBULATORY_CARE_PROVIDER_SITE_OTHER): Payer: BLUE CROSS/BLUE SHIELD | Admitting: Certified Nurse Midwife

## 2015-10-18 ENCOUNTER — Encounter: Payer: Self-pay | Admitting: Certified Nurse Midwife

## 2015-10-18 ENCOUNTER — Other Ambulatory Visit: Payer: BLUE CROSS/BLUE SHIELD

## 2015-10-18 VITALS — BP 126/82 | HR 76 | Resp 16 | Ht 67.75 in | Wt 232.0 lb

## 2015-10-18 DIAGNOSIS — N951 Menopausal and female climacteric states: Secondary | ICD-10-CM

## 2015-10-18 DIAGNOSIS — Z0000 Encounter for general adult medical examination without abnormal findings: Secondary | ICD-10-CM | POA: Diagnosis not present

## 2015-10-18 DIAGNOSIS — R899 Unspecified abnormal finding in specimens from other organs, systems and tissues: Secondary | ICD-10-CM | POA: Diagnosis not present

## 2015-10-18 DIAGNOSIS — N912 Amenorrhea, unspecified: Secondary | ICD-10-CM

## 2015-10-18 NOTE — Patient Instructions (Signed)
Hypertension Hypertension, commonly called high blood pressure, is when the force of blood pumping through your arteries is too strong. Your arteries are the blood vessels that carry blood from your heart throughout your body. A blood pressure reading consists of a higher number over a lower number, such as 110/72. The higher number (systolic) is the pressure inside your arteries when your heart pumps. The lower number (diastolic) is the pressure inside your arteries when your heart relaxes. Ideally you want your blood pressure below 120/80. Hypertension forces your heart to work harder to pump blood. Your arteries may become narrow or stiff. Having untreated or uncontrolled hypertension can cause heart attack, stroke, kidney disease, and other problems. RISK FACTORS Some risk factors for high blood pressure are controllable. Others are not.  Risk factors you cannot control include:   Race. You may be at higher risk if you are African American.  Age. Risk increases with age.  Gender. Men are at higher risk than women before age 45 years. After age 65, women are at higher risk than men. Risk factors you can control include:  Not getting enough exercise or physical activity.  Being overweight.  Getting too much fat, sugar, calories, or salt in your diet.  Drinking too much alcohol. SIGNS AND SYMPTOMS Hypertension does not usually cause signs or symptoms. Extremely high blood pressure (hypertensive crisis) may cause headache, anxiety, shortness of breath, and nosebleed. DIAGNOSIS To check if you have hypertension, your health care provider will measure your blood pressure while you are seated, with your arm held at the level of your heart. It should be measured at least twice using the same arm. Certain conditions can cause a difference in blood pressure between your right and left arms. A blood pressure reading that is higher than normal on one occasion does not mean that you need treatment. If  it is not clear whether you have high blood pressure, you may be asked to return on a different day to have your blood pressure checked again. Or, you may be asked to monitor your blood pressure at home for 1 or more weeks. TREATMENT Treating high blood pressure includes making lifestyle changes and possibly taking medicine. Living a healthy lifestyle can help lower high blood pressure. You may need to change some of your habits. Lifestyle changes may include:  Following the DASH diet. This diet is high in fruits, vegetables, and whole grains. It is low in salt, red meat, and added sugars.  Keep your sodium intake below 2,300 mg per day.  Getting at least 30-45 minutes of aerobic exercise at least 4 times per week.  Losing weight if necessary.  Not smoking.  Limiting alcoholic beverages.  Learning ways to reduce stress. Your health care provider may prescribe medicine if lifestyle changes are not enough to get your blood pressure under control, and if one of the following is true:  You are 18-59 years of age and your systolic blood pressure is above 140.  You are 60 years of age or older, and your systolic blood pressure is above 150.  Your diastolic blood pressure is above 90.  You have diabetes, and your systolic blood pressure is over 140 or your diastolic blood pressure is over 90.  You have kidney disease and your blood pressure is above 140/90.  You have heart disease and your blood pressure is above 140/90. Your personal target blood pressure may vary depending on your medical conditions, your age, and other factors. HOME CARE INSTRUCTIONS    Have your blood pressure rechecked as directed by your health care provider.   Take medicines only as directed by your health care provider. Follow the directions carefully. Blood pressure medicines must be taken as prescribed. The medicine does not work as well when you skip doses. Skipping doses also puts you at risk for  problems.  Do not smoke.   Monitor your blood pressure at home as directed by your health care provider. SEEK MEDICAL CARE IF:   You think you are having a reaction to medicines taken.  You have recurrent headaches or feel dizzy.  You have swelling in your ankles.  You have trouble with your vision. SEEK IMMEDIATE MEDICAL CARE IF:  You develop a severe headache or confusion.  You have unusual weakness, numbness, or feel faint.  You have severe chest or abdominal pain.  You vomit repeatedly.  You have trouble breathing. MAKE SURE YOU:   Understand these instructions.  Will watch your condition.  Will get help right away if you are not doing well or get worse.   This information is not intended to replace advice given to you by your health care provider. Make sure you discuss any questions you have with your health care provider.   Document Released: 07/07/2005 Document Revised: 11/21/2014 Document Reviewed: 04/29/2013 Elsevier Interactive Patient Education 2016 [employer]  

## 2015-10-18 NOTE — Progress Notes (Signed)
50 y.o. Married Caucasian female G2P2002 here to discuss labs and menopause. Patient aware she is consuming too many calories and weight has increased. She admits to juice daily and sweet tea or soda use. Usually consumes 3-4 glasses water daily. Does eat out occasionally and eats boxed foods she takes to work. Eats oatmeal or cereal for breakfast and lunch and dinner varies. Participates in no exercise. Patient has increased weight by another 10 pounds over the past year. Patient is having some hot flashes, but night sweats more frequently. No insomnia at present. Feels she is managing well, no anxiety issues, even though very busy at work. No other concerns today. Contraception none, but no sexually active in greater than 3 months.   Current  Weight 2323 with BMI 35.54 Labs; Triglycerides 222 VLDL 44 Cholesterol 200 Hgb 6.3 FSH 73.1  Family history mother hypertension, overweight  O: Healthy WD,WN female Affect: normal, orientation x 3  A:Morbid obesity with 10 pound weight gain in past year Perimenopausal with amenorrhea FSH 73.1 Elevated lab results with increase risk for type diabetes  Borderline BP elevation at last visit, normal today Exercise none    P: Discussed findings of elevated FSH which is menopausal range and may or may not have anymore bleeding. Due to prolonged time with no menses. Provera challenge indicated. If HCG qual. Will send Rx of Provera 10 mg x 10 days. She needs to call if bleeding or no bleeding up to 2 weeks after completion of medication. Patient voiced understanding. Patient has been reading information given on menopause and feels better now. Discussed HRT if symptoms increase and are interfering with daily life. Risks/benefits given. Not interested in HRT at present. Discussed elevated lab results and increase CVD risk change from 2.6 (2015) to 4.3 and elevated Hgb A1-C relationship with metabolic syndrome of borderline hypertension with glucose changes and  weight increase. Discussed dietary changes with eliminating sugar loaded drinks and increase water intake. Work on complex carbohydrates, such as whole grains, not instant Oatmeal, limiting bread, pasta,potatoes( most all white foods) decrease of limit intake. Work on 6 small meals daily with lean protein and lots of colorful vegetables. Limit banana and grape use due to sugar content, concentrate on other fruits. Limit juice due to sugar and carbohydrate content. Discussed portion control for weight control. Questions addressed. Discussed limiting salt in diet which increases risk of hypertension. Cook with fresh vegetables and use herbs for seasoning rather than salt. Patient feels she can do this and spouse might help. Will check BP at home and advise if becomes elevated again. Has a tread mill and plans to exercise for 30 minutes prior to work and now to 4 flights of steps to her office. Encouraged patient to keep this plan!   Labs  HCG qual., Hgb. A1-C,AMH  Recheck labs 3 months with ov  Time spent in face to face consult regarding menopause, diet and exercise and lab review  35 minutes.

## 2015-10-19 ENCOUNTER — Other Ambulatory Visit: Payer: Self-pay | Admitting: Certified Nurse Midwife

## 2015-10-19 ENCOUNTER — Telehealth: Payer: Self-pay

## 2015-10-19 DIAGNOSIS — N912 Amenorrhea, unspecified: Secondary | ICD-10-CM

## 2015-10-19 LAB — HEMOGLOBIN A1C
Hgb A1c MFr Bld: 6.4 % — ABNORMAL HIGH (ref <5.7)
Mean Plasma Glucose: 137 mg/dL

## 2015-10-19 LAB — HCG, SERUM, QUALITATIVE: PREG SERUM: NEGATIVE

## 2015-10-19 MED ORDER — MEDROXYPROGESTERONE ACETATE 10 MG PO TABS: 10.0000 mg | ORAL_TABLET | Freq: Every day | ORAL | Status: DC

## 2015-10-19 NOTE — Telephone Encounter (Signed)
Patient notified of results. See lab 

## 2015-10-19 NOTE — Progress Notes (Signed)
Encounter reviewed Aanyah Loa, MD   

## 2015-10-19 NOTE — Telephone Encounter (Signed)
-----   Message from Verner Choleborah S Leonard, CNM sent at 10/19/2015  7:49 AM EDT ----- Notify patient that HGb A1-C is 6.4 previous 6.3 work diet as discussed. Pregnancy test negative  Needs Provera challenge. Rx Provera 10 mg x 10 days needs to call if bleeding or no bleeding up two weeks after completing medication Order in

## 2015-10-19 NOTE — Telephone Encounter (Signed)
lmtcb

## 2015-10-22 LAB — ANTI MULLERIAN HORMONE: AMH AssessR: <0.03 ng/mL

## 2015-11-06 ENCOUNTER — Ambulatory Visit
Admission: RE | Admit: 2015-11-06 | Discharge: 2015-11-06 | Disposition: A | Discharge status: Home / Self Care | Payer: BLUE CROSS/BLUE SHIELD | Source: Ambulatory Visit

## 2015-11-06 DIAGNOSIS — Z1231 Encounter for screening mammogram for malignant neoplasm of breast: Secondary | ICD-10-CM

## 2015-11-16 ENCOUNTER — Telehealth: Payer: Self-pay | Admitting: Certified Nurse Midwife

## 2015-11-16 NOTE — Telephone Encounter (Signed)
Left message to call Kaitlyn at [phone]. 

## 2015-11-16 NOTE — Telephone Encounter (Signed)
Patient is calling to give information after taking Provera. Patient did not have any results after taking the Provera.

## 2015-11-16 NOTE — Telephone Encounter (Signed)
agree

## 2015-11-16 NOTE — Telephone Encounter (Signed)
Spoke with patient. Patient states she completed her Provera on 10/31/2015. States she has not had any bleeding. Patient's AMH was <0.03 and her FSH was 73.1 on 10/04/2015. Advised with not having any bleeding after taking Provera and labs indicating menopause and infertility she does not need to do anything further at this time. She will need to monitor for any bleeding. If she has any bleeding it will be important to contact the office for further evaluation. She is agreeable and verbalizes understanding.  Routing to PepsiCoDeborah Leonard CNM for review prior to closing.

## 2015-12-27 ENCOUNTER — Ambulatory Visit (INDEPENDENT_AMBULATORY_CARE_PROVIDER_SITE_OTHER): Payer: BLUE CROSS/BLUE SHIELD | Admitting: Certified Nurse Midwife

## 2015-12-27 ENCOUNTER — Encounter: Payer: Self-pay | Admitting: Certified Nurse Midwife

## 2015-12-27 DIAGNOSIS — R899 Unspecified abnormal finding in specimens from other organs, systems and tissues: Secondary | ICD-10-CM

## 2015-12-27 DIAGNOSIS — E669 Obesity, unspecified: Secondary | ICD-10-CM | POA: Diagnosis not present

## 2015-12-27 NOTE — Progress Notes (Signed)
50 y.o. Married Caucasian female G2P2002 here for follow up of weight loss and monitoring sugar intake. Has changed tea to no sugar and decreased carbohydrates and fat in diet. Using only [place], lean pork and chicken and eggs as her protein sources. She is monitoring portion size at every meal. Has stopped taking elevator at work and walks 4 flights of stairs to office. Exercise routine now established with rising at 5 am and exercising on treadmill for 30 minutes, now at 1 1/2 mile. Walking with weights now also for 15 minutes. Enjoying it now and looks forward to her time. Has not weighed. Feels she could do better with fruit intake and plans to work on next. Feels much better. Spouse who is morbidly obese, said he might try to cut back as she has and has lost 30 pounds.  O: Healthy WD,WN female Affect: normal orientation x 3 Weight today 221, down 13 pounds in the past 2 1/2 months  A:Diet and weight loss in progress  Elevated Hgb A1-c and triglycerides    P: Discussed findings of weight loss and congratulated her on her progress. Discussed adding another exercise of wall push ups to strengthen core and increase calorie burn. Discussed other addition to diet and changes in fruit intake. Questions addressed. Will schedule next appointment base on lab results Labs Hgb A1-C, Lipid panel    RV as above, prn  ~15 minutes spent with patient >50% of time was in face to face discussion of above.

## 2015-12-28 LAB — LIPID PANEL
CHOLESTEROL: 167 mg/dL (ref 125–200)
HDL: 38 mg/dL — AB (ref >=46)
LDL CALC: 62 mg/dL (ref <130)
TRIGLYCERIDES: 336 mg/dL — AB (ref <150)
Total CHOL/HDL Ratio: 4.4 Ratio (ref <=5.0)
VLDL: 67 mg/dL — AB (ref <30)

## 2015-12-28 LAB — HEMOGLOBIN A1C
Hgb A1c MFr Bld: 6.1 % — ABNORMAL HIGH (ref <5.7)
Mean Plasma Glucose: 128 mg/dL

## 2015-12-28 NOTE — Progress Notes (Signed)
Reviewed personally.  M. Suzanne Cristi Gwynn, MD.  

## 2016-04-03 ENCOUNTER — Ambulatory Visit (INDEPENDENT_AMBULATORY_CARE_PROVIDER_SITE_OTHER): Payer: BLUE CROSS/BLUE SHIELD | Admitting: Certified Nurse Midwife

## 2016-04-03 ENCOUNTER — Encounter: Payer: Self-pay | Admitting: Certified Nurse Midwife

## 2016-04-03 DIAGNOSIS — R634 Abnormal weight loss: Secondary | ICD-10-CM

## 2016-04-03 DIAGNOSIS — R899 Unspecified abnormal finding in specimens from other organs, systems and tissues: Secondary | ICD-10-CM

## 2016-04-03 LAB — HEMOGLOBIN A1C
Hgb A1c MFr Bld: 5.6 % (ref <5.7)
MEAN PLASMA GLUCOSE: 114 mg/dL

## 2016-04-03 LAB — TRIGLYCERIDES: TRIGLYCERIDES: 131 mg/dL (ref <150)

## 2016-04-03 NOTE — Progress Notes (Signed)
50 y.o. Married Caucasian female G2P2002 here for follow up of weight loss and elevated lipid profile  working with diet and exercise. Has added weights and wall push ups. Has lost now 26 pounds since working on diet since initial consultation 10/18/15. Walks treadmill every morning 4-5 miles, eats lean protein and no sugar drinks now. Good variety of steamed, fresh or stir fry vegetables also. Water is her main beverage. Spouse is still losing weight 40 pounds now. Patient feels confident with plan now, but needs reinforcement to continue.  Feels so much better, no SOB or fatigue. Fitting in old clothes now. No other health issues today. Fasted for labs.   O: Healthy WD,WN female Affect: normal, orientation x 3   A:Obesity with weight loss in progress and dietary management with exercise 26 pound weight loss in 6 months. Elevated lipid profile and Hgb. A1-C, labs recheck today     P: Discussed with patient how much progress she is making slowly and changing her eating patterns which will her health changes. Continue with diet currently she is following, with adequate lean protein. Add yoga if possible to exercise plan for flexability.  Continue current exercise pattern. Watch salt intake also with limited sugar intake. Questions addressed.  Labs: Triglycerides, Hgb A1-C   RV 3 months recheck weight

## 2016-04-06 NOTE — Progress Notes (Signed)
Encounter reviewed Jae Bruck, MD   

## 2016-07-03 ENCOUNTER — Ambulatory Visit: Payer: BLUE CROSS/BLUE SHIELD | Admitting: Certified Nurse Midwife

## 2016-07-08 ENCOUNTER — Encounter: Payer: Self-pay | Admitting: Certified Nurse Midwife

## 2016-07-08 ENCOUNTER — Ambulatory Visit (INDEPENDENT_AMBULATORY_CARE_PROVIDER_SITE_OTHER): Payer: BLUE CROSS/BLUE SHIELD | Admitting: Certified Nurse Midwife

## 2016-07-08 VITALS — BP 120/78 | HR 72 | Resp 16 | Ht 67.75 in | Wt 208.0 lb

## 2016-07-08 DIAGNOSIS — E669 Obesity, unspecified: Secondary | ICD-10-CM

## 2016-07-08 NOTE — Progress Notes (Signed)
50 y.o. Married Caucasian female G2P2002 here for follow up of weight loss and diet change due to obesity and elevated lipid profile and Hgb. A1-c.  Patient continues to use the treadmill and had to repair due to use! Also walking daily. Feels she needs to change her pattern. Also now changing diet to increase protein. Happy with choices to change to increase her health and wellness profile. 26 pound weight loss. Walking 6 miles/day. Plans to start doing plank exercises, daily and goal of 10 more pounds. Menopausal no vaginal bleeding or hot flashes. No other health issues today.  O: Healthy WD,WN female Affect: normal, orientation x 3   A: Morbid obesity with weight loss Needs to change exercise up    P: Discussed need to change exercise and increase protein and fiber sources.  Recheck on aex. Questions addressed.   18 minutes in time spent with dietary and exercise counsel.   RV prn

## 2016-07-08 NOTE — Patient Instructions (Signed)
High-Protein and High-Calorie Diet Introduction Eating high-protein and high-calorie foods can help you to gain weight, heal after an injury, and recover after an illness or surgery. What is my plan? The specific amount of daily protein and calories you need depends on:  Your body weight.  The reason this diet is recommended for you. Generally, a high-protein, high-calorie diet involves:  Eating 250-500 extra calories each day.  Making sure that 10-35% of your daily calories come from protein. Talk to your health care provider about how much protein and how many calories you need each day. Follow the diet as directed by your health care provider. What do I need to know about this diet?  Ask your health care provider if you should take a nutritional supplement.  Try to eat six small meals each day instead of three large meals.  Eat a balanced diet, including one food that is high in protein at each meal.  Keep nutritious snacks handy, such as nuts, trail mixes, dried fruit, and yogurt.  If you have kidney disease or diabetes, eating too much protein may put extra stress on your kidneys. Talk to your health care provider if you have either of those conditions. What are some high-protein foods? Grains  Quinoa. Bulgur wheat. Vegetables  Soybeans. Peas. Meats and Other Protein Sources  Beef, pork, and poultry. Fish and seafood. Eggs. Tofu. Textured vegetable protein (TVP). Peanut butter. Nuts and seeds. Dried beans. Protein powders. Dairy  Whole milk. Whole-milk yogurt. Powdered milk. Cheese. Cottage Cheese. Eggnog. Beverages  High-protein supplement drinks. Soy milk. Other  Protein bars. The items listed above may not be a complete list of recommended foods or beverages. Contact your dietitian for more options.  What are some high-calorie foods? Grains  Pasta. Quick breads. Muffins. Pancakes. Ready-to-eat cereal. Vegetables  Vegetables cooked in oil or butter. Fried  potatoes. Fruits  Dried fruit. Fruit leather. Canned fruit in syrup. Fruit juice. Avocados. Meats and Other Protein Sources  Peanut butter. Nuts and seeds. Dairy  Heavy cream. Whipped cream. Cream cheese. Sour cream. Ice cream. Custard. Pudding. Beverages  Meal-replacement beverages. Nutrition shakes. Fruit juice. Sugar-sweetened soft drinks. Condiments  Salad dressing. Mayonnaise. Alfredo sauce. Fruit preserves or jelly. Honey. Syrup. Sweets/Desserts  Cake. Cookies. Pie. Pastries. Candy bars. Chocolate. Fats and Oils  Butter or margarine. Oil. Gravy. Other  Meal-replacement bars. The items listed above may not be a complete list of recommended foods or beverages. Contact your dietitian for more options.  What are some tips for including high-protein and high-calorie foods in my diet?  Add whole milk, half-and-half, or heavy cream to cereal, pudding, soup, or hot cocoa.  Add whole milk to instant breakfast drinks.  Add peanut butter to oatmeal or smoothies.  Add powdered milk to baked goods, smoothies, or milkshakes.  Add powdered milk, cream, or butter to mashed potatoes.  Add cheese to cooked vegetables.  Make whole-milk yogurt parfaits. Top them with granola, fruit, or nuts.  Add cottage cheese to your fruit.  Add avocados, cheese, or both to sandwiches or salads.  Add meat, poultry, or seafood to rice, pasta, casseroles, salads, and soups.  Use mayonnaise when making egg salad, chicken salad, or tuna salad.  Use peanut butter as a topping for pretzels, celery, or crackers.  Add beans to casseroles, dips, and spreads.  Add pureed beans to sauces and soups.  Replace calorie-free drinks with calorie-containing drinks, such as milk and fruit juice. This information is not intended to replace advice given to   you by your health care provider. Make sure you discuss any questions you have with your health care provider. Document Released: 07/07/2005 Document Revised:  12/13/2015 Document Reviewed: 12/20/2013  2017 Elsevier  

## 2016-07-11 NOTE — Progress Notes (Signed)
Encounter reviewed Teresia Myint, MD   

## 2016-10-09 ENCOUNTER — Encounter: Payer: Self-pay | Admitting: Certified Nurse Midwife

## 2016-10-09 ENCOUNTER — Ambulatory Visit (INDEPENDENT_AMBULATORY_CARE_PROVIDER_SITE_OTHER): Payer: BLUE CROSS/BLUE SHIELD | Admitting: Certified Nurse Midwife

## 2016-10-09 VITALS — BP 124/80 | HR 68 | Temp 98.6°F | Resp 16 | Ht 67.75 in | Wt 202.0 lb

## 2016-10-09 DIAGNOSIS — E559 Vitamin D deficiency, unspecified: Secondary | ICD-10-CM | POA: Diagnosis not present

## 2016-10-09 DIAGNOSIS — N6320 Unspecified lump in the left breast, unspecified quadrant: Secondary | ICD-10-CM

## 2016-10-09 DIAGNOSIS — E669 Obesity, unspecified: Secondary | ICD-10-CM

## 2016-10-09 DIAGNOSIS — Z0000 Encounter for general adult medical examination without abnormal findings: Secondary | ICD-10-CM

## 2016-10-09 DIAGNOSIS — Z124 Encounter for screening for malignant neoplasm of cervix: Secondary | ICD-10-CM

## 2016-10-09 DIAGNOSIS — Z01419 Encounter for gynecological examination (general) (routine) without abnormal findings: Secondary | ICD-10-CM

## 2016-10-09 LAB — HEMOGLOBIN A1C
Hgb A1c MFr Bld: 5.4 % (ref <5.7)
MEAN PLASMA GLUCOSE: 108 mg/dL

## 2016-10-09 LAB — LIPID PANEL
CHOL/HDL RATIO: 4.3 ratio (ref <5.0)
CHOLESTEROL: 200 mg/dL — AB (ref <200)
HDL: 46 mg/dL — ABNORMAL LOW (ref >50)
LDL Cholesterol: 120 mg/dL — ABNORMAL HIGH (ref <100)
Triglycerides: 171 mg/dL — ABNORMAL HIGH (ref <150)
VLDL: 34 mg/dL — ABNORMAL HIGH (ref <30)

## 2016-10-09 NOTE — Progress Notes (Signed)
51 y.o. 662P2002 Married  Caucasian Fe here for annual exam. Menopausal no HRT. Denies vaginal bleeding or vaginal dryness. Still working on weight loss, down another 8 pounds. Total weight loss in past 9 months 31 pounds. Changing up exercise with planks now. Seeing PCP prn. Screening labs today.  Social stress with spouse chronic pain and health issues. Has good family support. No other health issues today.  Patient's last menstrual period was 04/21/2015.          Sexually active: No.  The current method of family planning is post menopausal status.    Exercising: Yes.    walking Smoker:  no  Health Maintenance: Pap:  10-03-14 neg MMG:  11-06-15 category c density birads 1:neg Colonoscopy:  2016 f/u 5832yrs BMD:   none TDaP:  2012 Shingles: no Pneumonia: no Hep C and HIV: both neg 2017 Labs: none Self breast exam: done occ   reports that she has never smoked. She has never used smokeless tobacco. She reports that she does not drink alcohol or use drugs.  Past Medical History:  Diagnosis Date  . Hydrosalpinx    right    Past Surgical History:  Procedure Laterality Date  . LAPAROSCOPIC CHOLECYSTECTOMY  2003    Current Outpatient Prescriptions  Medication Sig Dispense Refill  . Chlorpheniramine Maleate (ALLERGY PO) Take by mouth 3 times/day as needed-between meals & bedtime.    . meloxicam (MOBIC) 15 MG tablet 3 times/day as needed-between meals & bedtime.  1  . Multiple Vitamins-Minerals (MULTIVITAMIN PO) Take by mouth daily.     No current facility-administered medications for this visit.     Family History  Problem Relation Age of Onset  . Hypertension Mother   . Kidney failure Mother     ROS:  Pertinent items are noted in HPI.  Otherwise, a comprehensive ROS was negative.  Exam:   BP 124/80   Pulse 68   Resp 16   Ht 5' 7.75" (1.721 m)   Wt 202 lb (91.6 kg)   LMP 04/21/2015   BMI 30.94 kg/m  Height: 5' 7.75" (172.1 cm) Ht Readings from Last 3 Encounters:   10/09/16 5' 7.75" (1.721 m)  07/08/16 5' 7.75" (1.721 m)  04/03/16 5' 7.75" (1.721 m)    General appearance: alert, cooperative and appears stated age Head: Normocephalic, without obvious abnormality, atraumatic Neck: no adenopathy, supple, symmetrical, trachea midline and thyroid normal to inspection and palpation Lungs: clear to auscultation bilaterally Breasts: normal appearance, no masses or tenderness, No nipple retraction or dimpling, No nipple discharge or bleeding, No axillary or supraclavicular adenopathy, left breast mass noted at 2-3 o'clock, mobile 2-3 cm 3 fb out from areola Heart: regular rate and rhythm Abdomen: soft, non-tender; no masses,  no organomegaly Extremities: extremities normal, atraumatic, no cyanosis or edema Skin: Skin color, texture, turgor normal. No rashes or lesions Lymph nodes: Cervical, supraclavicular, and axillary nodes normal. No abnormal inguinal nodes palpated Neurologic: Grossly normal   Pelvic: External genitalia:  no lesions              Urethra:  normal appearing urethra with no masses, tenderness or lesions              Bartholin's and Skene's: normal                 Vagina: normal appearing vagina with normal color and discharge, no lesions              Cervix: multiparous appearance, no bleeding  following Pap, no cervical motion tenderness and no lesions              Pap taken: Yes.   Bimanual Exam:  Uterus:  normal size, contour, position, consistency, mobility, non-tender              Adnexa: normal adnexa and no mass, fullness, tenderness               Rectovaginal: Confirms               Anus:  normal sphincter tone, no lesions  Chaperone present: yes  A:  Well Woman with normal exam  Menopausal no HRT  Left breast mass  Intentional weight loss to decrease lipid profile  Screening labs  P:   Reviewed health and wellness pertinent to exam  Aware of need to advise if vaginal bleeding  Discussed findings and need to evaluate  with diagnositic mammogram and [place]. Questions addressed. Patient will be scheduled prior to leaving today.  Continue current plan, recheck in office in 6 months.  Labs: Vitamin D, Lipid panel, CMP, HGb A1-c  Pap smear as above   counseled on breast self exam, mammography screening, adequate intake of calcium and vitamin D, diet and exercise return annually or prn  An After Visit Summary was printed and given to the patient.

## 2016-10-09 NOTE — Patient Instructions (Signed)

## 2016-10-09 NOTE — Progress Notes (Signed)
Spoke with Debra Tyler at [facility]. Patients last screening  MMG 11/06/15. Patient does not wish to wait until 4/18 to schedule bilateral diagnostic MMG, will proceed with scheduling left breast diagnostic MMG and ultrasound for left breast mass.  Patient scheduled while in office for 10/10/16 at 8:30am. Patient is agreeable to date and time.

## 2016-10-10 ENCOUNTER — Telehealth: Payer: Self-pay

## 2016-10-10 ENCOUNTER — Other Ambulatory Visit: Payer: Self-pay | Admitting: Certified Nurse Midwife

## 2016-10-10 ENCOUNTER — Ambulatory Visit
Admission: RE | Admit: 2016-10-10 | Discharge: 2016-10-10 | Disposition: A | Discharge status: Home / Self Care | Payer: BLUE CROSS/BLUE SHIELD | Source: Ambulatory Visit | Attending: Certified Nurse Midwife | Admitting: Certified Nurse Midwife

## 2016-10-10 DIAGNOSIS — N6320 Unspecified lump in the left breast, unspecified quadrant: Secondary | ICD-10-CM

## 2016-10-10 DIAGNOSIS — E785 Hyperlipidemia, unspecified: Secondary | ICD-10-CM

## 2016-10-10 DIAGNOSIS — E559 Vitamin D deficiency, unspecified: Secondary | ICD-10-CM

## 2016-10-10 LAB — VITAMIN D 25 HYDROXY (VIT D DEFICIENCY, FRACTURES): VIT D 25 HYDROXY: 30 ng/mL (ref 30–100)

## 2016-10-10 LAB — IPS PAP TEST WITH REFLEX TO HPV

## 2016-10-10 NOTE — Telephone Encounter (Signed)
-----   Message from Verner Choleborah S Leonard, CNM sent at 10/10/2016  8:09 AM EDT ----- Notify patient that Vitamin D is borderline low needs daily Vitamin D 3 1000 IU Hgb A1-C is 5.4 a year ago 6.3! Lipid panel cholesterol is borderline normal at 200, but not fasting Triglycerides were 171, previous 131 HDL helpful cholesterol is 46 previous 38, needs to be > 50, but exercise and diet and fiber in diet will help LDL is 120 normal is 100 as above exercise and diet will help with this.  Overall profile much better! Continue on weight loss, would recommend Omega 3 fish oil daily, [facility] Recheck in 6 months orders placed, please schedule. Needs to be fasting

## 2016-10-10 NOTE — Telephone Encounter (Signed)
Mailbox full. Try again. 

## 2016-10-12 NOTE — Progress Notes (Signed)
Encounter reviewed Shaan Rhoads, MD   

## 2016-10-14 ENCOUNTER — Telehealth: Payer: Self-pay | Admitting: Certified Nurse Midwife

## 2016-10-14 NOTE — Telephone Encounter (Signed)
Patient notified of results. See lab 

## 2016-10-14 NOTE — Telephone Encounter (Signed)
Phone call to patient regarding mammogram results. Patient feels tenderness is resolving and she is not palpating the area as before. Reviewed results per report with patient. Questions addressed. Will recheck area in 2 weeks unless otherwise needed or change. Patient scheduled appointment while on phone.

## 2016-10-28 ENCOUNTER — Ambulatory Visit (INDEPENDENT_AMBULATORY_CARE_PROVIDER_SITE_OTHER): Payer: BLUE CROSS/BLUE SHIELD | Admitting: Certified Nurse Midwife

## 2016-10-28 ENCOUNTER — Encounter: Payer: Self-pay | Admitting: Certified Nurse Midwife

## 2016-10-28 VITALS — BP 114/80 | HR 70 | Resp 16 | Ht 67.75 in | Wt 201.0 lb

## 2016-10-28 DIAGNOSIS — N644 Mastodynia: Secondary | ICD-10-CM

## 2016-10-28 DIAGNOSIS — N6320 Unspecified lump in the left breast, unspecified quadrant: Secondary | ICD-10-CM | POA: Diagnosis not present

## 2016-10-28 NOTE — Patient Instructions (Signed)
Breast Tenderness Breast tenderness is a common problem for women of all ages. Breast tenderness may cause mild discomfort to severe pain. The pain usually comes and goes in association with your menstrual cycle, but it can be constant. Breast tenderness has many possible causes, including hormone changes and some medicines. Your health care provider may order tests, such as a mammogram or an ultrasound, to check for any unusual findings. Having breast tenderness usually does not mean that you have breast cancer. Follow these instructions at home: Sometimes, reassurance that you do not have breast cancer is all that is needed. In general, follow these home care instructions: Managing pain and discomfort  If directed, apply ice to the area:  Put ice in a plastic bag.  Place a towel between your skin and the bag.  Leave the ice on for 20 minutes, 2-3 times a day.  Make sure you are wearing a supportive bra, especially during exercise. You may also want to wear a supportive bra while sleeping if your breasts are very tender. Medicines  Take over-the-counter and prescription medicines only as told by your health care provider. If the cause of your pain is infection, you may be prescribed an antibiotic medicine.  If you were prescribed an antibiotic, take it as told by your health care provider. Do not stop taking the antibiotic even if you start to feel better. General instructions  Your health care provider may recommend that you reduce the amount of fat in your diet. You can do this by:  Limiting fried foods.  Cooking foods using methods, such as baking, boiling, grilling, and broiling.  Decrease the amount of caffeine in your diet. You can do this by drinking more water and choosing caffeine-free options.  Keep a log of the days and times when your breasts are most tender.  Ask your health care provider how to do breast exams at home. This will help you notice if you have an unusual  growth or lump. Contact a health care provider if:  Any part of your breast is hard, red, and hot to the touch. This may be a sign of infection.  You are not breastfeeding and you have fluid, especially blood or pus, coming out of your nipples.  You have a fever.  You have a new or painful lump in your breast that remains after your menstrual period ends.  Your pain does not improve or it gets worse.  Your pain is interfering with your daily activities. This information is not intended to replace advice given to you by your health care provider. Make sure you discuss any questions you have with your health care provider. Document Released: 06/19/2008 Document Revised: 04/04/2016 Document Reviewed: 04/04/2016 Elsevier Interactive Patient Education  2017 Elsevier Inc[employer]  

## 2016-10-28 NOTE — Progress Notes (Signed)
51 y.o. Married Caucasian female G2P2002 here for follow up  Breast tenderness noted 10/09/16 and mass in left breast. Mammogram showed no malignancy, only benign findings. Did show benign low density mass, no change from previous mammogram. Patient feels tenderness is resolving and can not feel area a well as before. Has decrease caffeine intake and feels this has also helped. Denies nipple discharge or other mass findings. Wonders if underwire bra is part of the problem. Has decrease plank exercise time and this has also helped. No other health issues today.   ROS: pertinent to HPI  O: Healthy WD,WN female Affect: normal Skin: warm and dry. Axillary bilateral  No masses or enlarged lymph nodes or tenderness Right breast: no masses or skin change, fatty feel, no nipple discharge Left breast : mass ? Cystic feel noted at 2-3 o'clock decreasing size, nontender, no skin change, no nipple discharge  A:Left breast mass with tenderness, probable cyst, resolving Benign findings on mammogram and [place] Has routine mammogram screening on 11/06/16 for follow up   P: Discussed findings of resolving mass, probable cyst and tenderness on exam. Continue to monitor. Discussed continue reduced caffeine intake. Start Vitamin E 400 IU daily to help with tenderness. Consider going without underwire bra or removing underwire to see if resolves. If notes in increase in size needs OV, do follow up mammogram in 2 weeks.   RV prn

## 2016-10-29 NOTE — Progress Notes (Signed)
Encounter reviewed Debra Morro, MD   

## 2016-11-17 ENCOUNTER — Other Ambulatory Visit: Payer: Self-pay | Admitting: Certified Nurse Midwife

## 2016-11-17 DIAGNOSIS — Z1231 Encounter for screening mammogram for malignant neoplasm of breast: Secondary | ICD-10-CM

## 2016-11-20 ENCOUNTER — Ambulatory Visit
Admission: RE | Admit: 2016-11-20 | Discharge: 2016-11-20 | Disposition: A | Discharge status: Home / Self Care | Payer: BLUE CROSS/BLUE SHIELD | Source: Ambulatory Visit | Attending: Certified Nurse Midwife | Admitting: Certified Nurse Midwife

## 2016-11-20 DIAGNOSIS — Z1231 Encounter for screening mammogram for malignant neoplasm of breast: Secondary | ICD-10-CM

## 2017-04-16 ENCOUNTER — Other Ambulatory Visit: Payer: BLUE CROSS/BLUE SHIELD

## 2017-04-27 ENCOUNTER — Other Ambulatory Visit: Payer: BLUE CROSS/BLUE SHIELD

## 2017-04-27 DIAGNOSIS — E785 Hyperlipidemia, unspecified: Secondary | ICD-10-CM

## 2017-04-27 DIAGNOSIS — E559 Vitamin D deficiency, unspecified: Secondary | ICD-10-CM

## 2017-04-28 LAB — LIPID PANEL
CHOL/HDL RATIO: 3.8 ratio (ref 0.0–4.4)
CHOLESTEROL TOTAL: 210 mg/dL — AB (ref 100–199)
HDL: 55 mg/dL (ref >39)
LDL CALC: 130 mg/dL — AB (ref 0–99)
TRIGLYCERIDES: 124 mg/dL (ref 0–149)
VLDL CHOLESTEROL CAL: 25 mg/dL (ref 5–40)

## 2017-04-28 LAB — VITAMIN D 25 HYDROXY (VIT D DEFICIENCY, FRACTURES): Vit D, 25-Hydroxy: 27.3 ng/mL — ABNORMAL LOW (ref 30.0–100.0)

## 2017-10-14 ENCOUNTER — Ambulatory Visit: Payer: BLUE CROSS/BLUE SHIELD | Admitting: Certified Nurse Midwife

## 2017-11-09 ENCOUNTER — Other Ambulatory Visit: Payer: Self-pay | Admitting: Certified Nurse Midwife

## 2017-11-09 DIAGNOSIS — Z139 Encounter for screening, unspecified: Secondary | ICD-10-CM

## 2017-12-03 ENCOUNTER — Ambulatory Visit
Admission: RE | Admit: 2017-12-03 | Discharge: 2017-12-03 | Disposition: A | Discharge status: Home / Self Care | Payer: BLUE CROSS/BLUE SHIELD | Source: Ambulatory Visit | Attending: Certified Nurse Midwife | Admitting: Certified Nurse Midwife

## 2017-12-03 DIAGNOSIS — Z139 Encounter for screening, unspecified: Secondary | ICD-10-CM

## 2017-12-15 ENCOUNTER — Ambulatory Visit (INDEPENDENT_AMBULATORY_CARE_PROVIDER_SITE_OTHER): Payer: BLUE CROSS/BLUE SHIELD | Admitting: Certified Nurse Midwife

## 2017-12-15 ENCOUNTER — Other Ambulatory Visit: Payer: Self-pay

## 2017-12-15 ENCOUNTER — Encounter: Payer: Self-pay | Admitting: Certified Nurse Midwife

## 2017-12-15 VITALS — BP 124/80 | HR 70 | Resp 16 | Ht 67.25 in | Wt 210.0 lb

## 2017-12-15 DIAGNOSIS — Z01411 Encounter for gynecological examination (general) (routine) with abnormal findings: Secondary | ICD-10-CM

## 2017-12-15 DIAGNOSIS — E663 Overweight: Secondary | ICD-10-CM

## 2017-12-15 DIAGNOSIS — N951 Menopausal and female climacteric states: Secondary | ICD-10-CM | POA: Diagnosis not present

## 2017-12-15 DIAGNOSIS — Z8639 Personal history of other endocrine, nutritional and metabolic disease: Secondary | ICD-10-CM

## 2017-12-15 DIAGNOSIS — E559 Vitamin D deficiency, unspecified: Secondary | ICD-10-CM

## 2017-12-15 DIAGNOSIS — B373 Candidiasis of vulva and vagina: Secondary | ICD-10-CM

## 2017-12-15 DIAGNOSIS — B3731 Acute candidiasis of vulva and vagina: Secondary | ICD-10-CM

## 2017-12-15 MED ORDER — NYSTATIN-TRIAMCINOLONE 100000-0.1 UNIT/GM-% EX OINT: 1.0000 "application " | TOPICAL_OINTMENT | Freq: Two times a day (BID) | CUTANEOUS | 1 refills | Status: DC

## 2017-12-15 NOTE — Patient Instructions (Signed)

## 2017-12-15 NOTE — Progress Notes (Signed)
52 y.o. G3P2002 Married  Caucasian Fe here for annual exam. Periods none. Menopausal no HRT. Occasional hot flashes, night sweats, no issues with  insomnia . Feels like they are decreasing!   Sees PCP as needed. Still working on [employer] management with exercise, up a few pounds but working on switching diet up to other options for eating. Spouse has been working on weight loss and now down 80 pounds! No other health issues today.   Patient's last menstrual period was 04/21/2015.          Sexually active: Yes.    The current method of family planning is post menopausal status.    Exercising: Yes.    walking, planks Smoker:  no  Health Maintenance: Pap:  10-03-14 neg, 10-09-16 neg History of Abnormal Pap: no MMG:  12-03-17 category b density birads 1:neg Self Breast exams: yes Colonoscopy:  2016 f/u 40yrs BMD:   none TDaP:  2012 Shingles: no Pneumonia: no Hep C and HIV: both neg 2017 Labs: yes   reports that she has never smoked. She has never used smokeless tobacco. She reports that she does not drink alcohol or use drugs.  Past Medical History:  Diagnosis Date  . Hydrosalpinx    right    Past Surgical History:  Procedure Laterality Date  . BREAST BIOPSY  2006  . LAPAROSCOPIC CHOLECYSTECTOMY  2003    Current Outpatient Medications  Medication Sig Dispense Refill  . fexofenadine (ALLEGRA) 180 MG tablet Take 180 mg by mouth daily.    . Multiple Vitamins-Minerals (MULTIVITAMIN PO) Take by mouth daily.    . Omega-3 Fatty Acids (OMEGA 3 PO) Take by mouth daily.    . Triamcinolone Acetonide (NASACORT AQ NA) Place into the nose.    Marland Kitchen UNABLE TO FIND Hemp oil capsule    . VITAMIN E PO Take by mouth.     No current facility-administered medications for this visit.     Family History  Problem Relation Age of Onset  . Hypertension Mother   . Kidney failure Mother   . Breast cancer Cousin     ROS:  Pertinent items are noted in HPI.  Otherwise, a comprehensive ROS was  negative.  Exam:   BP 124/80   Pulse 70   Resp 16   Ht 5' 7.25" (1.708 m)   Wt 210 lb (95.3 kg)   LMP 04/21/2015   BMI 32.65 kg/m  Height: 5' 7.25" (170.8 cm) Ht Readings from Last 3 Encounters:  12/15/17 5' 7.25" (1.708 m)  10/28/16 5' 7.75" (1.721 m)  10/09/16 5' 7.75" (1.721 m)    General appearance: alert, cooperative and appears stated age Head: Normocephalic, without obvious abnormality, atraumatic Neck: no adenopathy, supple, symmetrical, trachea midline and thyroid normal to inspection and palpation Lungs: clear to auscultation bilaterally Breasts: normal appearance, no masses or tenderness, No nipple retraction or dimpling, No nipple discharge or bleeding, No axillary or supraclavicular adenopathy Heart: regular rate and rhythm Abdomen: soft, non-tender; no masses,  no organomegaly Extremities: extremities normal, atraumatic, no cyanosis or edema Skin: Skin color, texture, turgor normal. No rashes or lesions Lymph nodes: Cervical, supraclavicular, and axillary nodes normal. No abnormal inguinal nodes palpated Neurologic: Grossly normal   Pelvic: External genitalia:  no lesions              Urethra:  normal appearing urethra with no masses, tenderness or lesions              Bartholin's and Skene's: normal  Vagina: normal appearing vagina with normal color and discharge, no lesions              Cervix: multiparous appearance, no cervical motion tenderness and no lesions              Pap taken: No. Bimanual Exam:  Uterus:  normal size, contour, position, consistency, mobility, non-tender and anteverted              Adnexa: normal adnexa and no mass, fullness, tenderness               Rectovaginal: Confirms               Anus:  normal sphincter tone, no lesions  Chaperone present: yes  A:  Well Woman with normal exam  Menopausal no HRT  Weight management on going  Yeast vulvitis  Screening labs  P:   Reviewed health and wellness pertinent to  exam  Aware of need to evaluate if vaginal bleeding  Encouraged to continue weight loss and exercise, advise if needs assistance  Discussed yeast vulvitis and etiology . Discussed baking soda bath for comfort,loose clothing, change underwear if needed.  Rx Mycolog ointment see order with instructions  Labs: CMP, Hgb A1-C, Lipid panel, TSH, Vitamin D  Pap smear: no   counseled on breast self exam, mammography screening, menopause, adequate intake of calcium and vitamin D, diet and exercise  return annually or prn  An After Visit Summary was printed and given to the patient.

## 2017-12-16 LAB — COMPREHENSIVE METABOLIC PANEL
ALBUMIN: 4.4 g/dL (ref 3.5–5.5)
ALT: 6 IU/L (ref 0–32)
AST: 21 IU/L (ref 0–40)
Albumin/Globulin Ratio: 1.7 (ref 1.2–2.2)
Alkaline Phosphatase: 101 IU/L (ref 39–117)
BUN / CREAT RATIO: 13 (ref 9–23)
BUN: 12 mg/dL (ref 6–24)
Bilirubin Total: 0.7 mg/dL (ref 0.0–1.2)
CO2: 21 mmol/L (ref 20–29)
CREATININE: 0.91 mg/dL (ref 0.57–1.00)
Calcium: 9.9 mg/dL (ref 8.7–10.2)
Chloride: 101 mmol/L (ref 96–106)
GFR, EST AFRICAN AMERICAN: 83 mL/min/{1.73_m2} (ref >59)
GFR, EST NON AFRICAN AMERICAN: 72 mL/min/{1.73_m2} (ref >59)
GLOBULIN, TOTAL: 2.6 g/dL (ref 1.5–4.5)
Glucose: 88 mg/dL (ref 65–99)
Potassium: 4.6 mmol/L (ref 3.5–5.2)
SODIUM: 139 mmol/L (ref 134–144)
Total Protein: 7 g/dL (ref 6.0–8.5)

## 2017-12-16 LAB — LIPID PANEL
CHOLESTEROL TOTAL: 198 mg/dL (ref 100–199)
Chol/HDL Ratio: 4.2 ratio (ref 0.0–4.4)
HDL: 47 mg/dL (ref >39)
LDL Calculated: 112 mg/dL — ABNORMAL HIGH (ref 0–99)
Triglycerides: 193 mg/dL — ABNORMAL HIGH (ref 0–149)
VLDL CHOLESTEROL CAL: 39 mg/dL (ref 5–40)

## 2017-12-16 LAB — HEMOGLOBIN A1C
Est. average glucose Bld gHb Est-mCnc: 117 mg/dL
Hgb A1c MFr Bld: 5.7 % — ABNORMAL HIGH (ref 4.8–5.6)

## 2017-12-16 LAB — TSH: TSH: 1.54 u[IU]/mL (ref 0.450–4.500)

## 2017-12-16 LAB — VITAMIN D 25 HYDROXY (VIT D DEFICIENCY, FRACTURES): VIT D 25 HYDROXY: 36.9 ng/mL (ref 30.0–100.0)

## 2017-12-17 ENCOUNTER — Other Ambulatory Visit: Payer: Self-pay | Admitting: Certified Nurse Midwife

## 2017-12-17 DIAGNOSIS — R6889 Other general symptoms and signs: Secondary | ICD-10-CM

## 2018-06-21 ENCOUNTER — Telehealth: Payer: Self-pay | Admitting: Certified Nurse Midwife

## 2018-06-21 ENCOUNTER — Other Ambulatory Visit: Payer: BLUE CROSS/BLUE SHIELD

## 2018-06-21 NOTE — Telephone Encounter (Signed)
Patient canceled her fasting lab appointment today and rescheduled to 07/30/2018 at 8:30 am.

## 2018-07-30 ENCOUNTER — Other Ambulatory Visit (INDEPENDENT_AMBULATORY_CARE_PROVIDER_SITE_OTHER): Payer: BLUE CROSS/BLUE SHIELD

## 2018-07-30 DIAGNOSIS — R6889 Other general symptoms and signs: Secondary | ICD-10-CM

## 2018-07-31 LAB — HEMOGLOBIN A1C
Est. average glucose Bld gHb Est-mCnc: 117 mg/dL
Hgb A1c MFr Bld: 5.7 % — ABNORMAL HIGH (ref 4.8–5.6)

## 2018-07-31 LAB — TRIGLYCERIDES: Triglycerides: 132 mg/dL (ref 0–149)

## 2018-12-17 ENCOUNTER — Ambulatory Visit: Payer: BLUE CROSS/BLUE SHIELD | Admitting: Certified Nurse Midwife

## 2019-02-03 ENCOUNTER — Other Ambulatory Visit: Payer: Self-pay

## 2019-02-04 ENCOUNTER — Other Ambulatory Visit: Payer: Self-pay | Admitting: Certified Nurse Midwife

## 2019-02-04 DIAGNOSIS — Z1231 Encounter for screening mammogram for malignant neoplasm of breast: Secondary | ICD-10-CM

## 2019-02-04 NOTE — Progress Notes (Signed)
53 y.o. G37P2002 Married  Caucasian Fe here for annual exam. Menopausal no HRT, still having hot flashes, no problems.. Denies vaginal bleeding or vaginal dryness. Has gotten back to walking 1-2 miles/day and has only gained back 2 pounds! Working at home now remotely. Spouse recently had death of mother and still adjusting, his health is also not been good. Struggling with weight and glucose issues. She is helping with meal prep daily for them both. Fasted for labs today. No other health issues today. Sees PCP prn.  Patient's last menstrual period was 04/21/2015.          Sexually active: Yes.    The current method of family planning is post menopausal status.    Exercising: Yes.    walking Smoker:  no  Review of Systems  Constitutional: Negative.   HENT: Negative.   Eyes: Negative.   Respiratory: Negative.   Cardiovascular: Negative.   Gastrointestinal: Negative.   Genitourinary: Negative.   Musculoskeletal: Negative.   Skin: Negative.   Neurological: Negative.   Endo/Heme/Allergies: Negative.   Psychiatric/Behavioral: Negative.     Health Maintenance: Pap: 10-09-16 negative          10-03-14 negative  History of Abnormal Pap: no MMG:  12-03-17 density B/BIRADS 1 negative, scheduled for 03-24-2019 Self Breast exams: occ Colonoscopy:  2016, f/u 10 years  BMD:   no TDaP:  2012 Shingles: no Pneumonia: no Hep C and HIV: both negative 2017 Labs: discuss with provider    reports that she has never smoked. She has never used smokeless tobacco. She reports that she does not drink alcohol or use drugs.  Past Medical History:  Diagnosis Date  . Hydrosalpinx    right    Past Surgical History:  Procedure Laterality Date  . BREAST BIOPSY  2006  . LAPAROSCOPIC CHOLECYSTECTOMY  2003    Current Outpatient Medications  Medication Sig Dispense Refill  . Ascorbic Acid (VITAMIN C PO) Take by mouth.    . fexofenadine (ALLEGRA) 180 MG tablet Take 180 mg by mouth daily.    . Multiple  Vitamins-Minerals (MULTIVITAMIN PO) Take by mouth daily.    Marland Kitchen nystatin-triamcinolone ointment (MYCOLOG) Apply 1 application topically 2 (two) times daily. 30 g 1  . Omega-3 Fatty Acids (OMEGA 3 PO) Take by mouth daily.    . Triamcinolone Acetonide (NASACORT AQ NA) Place into the nose.    Marland Kitchen VITAMIN E PO Take by mouth.     No current facility-administered medications for this visit.     Family History  Problem Relation Age of Onset  . Hypertension Mother   . Kidney failure Mother   . Breast cancer Cousin     ROS:  Pertinent items are noted in HPI.  Otherwise, a comprehensive ROS was negative.  Exam:   BP 130/88 (BP Location: Right Arm, Patient Position: Sitting, Cuff Size: Large)   Pulse 80   Temp 98 F (36.7 C) (Temporal)   Resp 14   Ht 5\' 8"  (1.727 m)   Wt 212 lb 8 oz (96.4 kg)   LMP 04/21/2015   BMI 32.31 kg/m  Height: 5\' 8"  (172.7 cm) Ht Readings from Last 3 Encounters:  02/07/19 5\' 8"  (1.727 m)  12/15/17 5' 7.25" (1.708 m)  10/28/16 5' 7.75" (1.721 m)    General appearance: alert, cooperative and appears stated age Head: Normocephalic, without obvious abnormality, atraumatic Neck: no adenopathy, supple, symmetrical, trachea midline and thyroid normal to inspection and palpation Lungs: clear to auscultation bilaterally Breasts: normal  appearance, no masses or tenderness, No nipple retraction or dimpling, No nipple discharge or bleeding, No axillary or supraclavicular adenopathy Heart: regular rate and rhythm Abdomen: soft, non-tender; no masses,  no organomegaly Extremities: extremities normal, atraumatic, no cyanosis or edema Skin: Skin color, texture, turgor normal. No rashes or lesions Lymph nodes: Cervical, supraclavicular, and axillary nodes normal. No abnormal inguinal nodes palpated Neurologic: Grossly normal   Pelvic: External genitalia:  no lesions, normal female              Urethra:  normal appearing urethra with no masses, tenderness or lesions               Bartholin's and Skene's: normal                 Vagina: normal appearing vagina with normal color and discharge, no lesions              Cervix: multiparous appearance, no bleeding following Pap, no cervical motion tenderness and no lesions              Pap taken: Yes.   Bimanual Exam:  Uterus:  normal size, contour, position, consistency, mobility, non-tender and mid position              Adnexa: normal adnexa and no mass, fullness, tenderness               Rectovaginal: Confirms               Anus:  normal sphincter tone, no lesions  Chaperone present: yes  A:  Well Woman with normal exam  Menopausal not symptomatic, no HRT  Weight management continues with minimal gain/ exercises daily  Hyperlipidemia with diet control, labs today  Mammogram due has scheduled  P:   Reviewed health and wellness pertinent to exam  Discussed importance of advising if vaginal bleeding.  Stressed importance of diet and exercise for health.  Labs :Lipid panel, CMP, Hgb A1-c CBC, TSH, Vitamin D  Pap smear: yes   counseled on breast self exam, mammography screening, feminine hygiene, adequate intake of calcium and vitamin D, diet and exercise  return annually or prn  An After Visit Summary was printed and given to the patient.

## 2019-02-07 ENCOUNTER — Other Ambulatory Visit: Payer: Self-pay

## 2019-02-07 ENCOUNTER — Ambulatory Visit (INDEPENDENT_AMBULATORY_CARE_PROVIDER_SITE_OTHER): Payer: BC Managed Care – PPO | Admitting: Certified Nurse Midwife

## 2019-02-07 ENCOUNTER — Other Ambulatory Visit (HOSPITAL_COMMUNITY)
Admission: RE | Admit: 2019-02-07 | Discharge: 2019-02-07 | Disposition: A | Discharge status: Home / Self Care | Payer: BC Managed Care – PPO | Source: Ambulatory Visit | Attending: Obstetrics & Gynecology | Admitting: Obstetrics & Gynecology

## 2019-02-07 ENCOUNTER — Encounter: Payer: Self-pay | Admitting: Certified Nurse Midwife

## 2019-02-07 VITALS — BP 130/88 | HR 80 | Temp 98.0°F | Resp 14 | Ht 68.0 in | Wt 212.5 lb

## 2019-02-07 DIAGNOSIS — R6889 Other general symptoms and signs: Secondary | ICD-10-CM | POA: Diagnosis not present

## 2019-02-07 DIAGNOSIS — Z0000 Encounter for general adult medical examination without abnormal findings: Secondary | ICD-10-CM | POA: Diagnosis not present

## 2019-02-07 DIAGNOSIS — E559 Vitamin D deficiency, unspecified: Secondary | ICD-10-CM | POA: Diagnosis not present

## 2019-02-07 DIAGNOSIS — Z124 Encounter for screening for malignant neoplasm of cervix: Secondary | ICD-10-CM | POA: Insufficient documentation

## 2019-02-08 LAB — CYTOLOGY - PAP
Diagnosis: NEGATIVE
HPV: NOT DETECTED

## 2019-02-09 ENCOUNTER — Other Ambulatory Visit: Payer: Self-pay | Admitting: Certified Nurse Midwife

## 2019-02-09 DIAGNOSIS — R899 Unspecified abnormal finding in specimens from other organs, systems and tissues: Secondary | ICD-10-CM

## 2019-02-09 DIAGNOSIS — E559 Vitamin D deficiency, unspecified: Secondary | ICD-10-CM

## 2019-02-09 LAB — CBC
Hematocrit: 43.5 % (ref 34.0–46.6)
Hemoglobin: 14.1 g/dL (ref 11.1–15.9)
MCH: 29.3 pg (ref 26.6–33.0)
MCHC: 32.4 g/dL (ref 31.5–35.7)
MCV: 90 fL (ref 79–97)
Platelets: 364 10*3/uL (ref 150–450)
RBC: 4.81 x10E6/uL (ref 3.77–5.28)
RDW: 13.1 % (ref 11.7–15.4)
WBC: 6.6 10*3/uL (ref 3.4–10.8)

## 2019-02-09 LAB — COMPREHENSIVE METABOLIC PANEL
ALT: 10 IU/L (ref 0–32)
AST: 21 IU/L (ref 0–40)
Albumin/Globulin Ratio: 1.8 (ref 1.2–2.2)
Albumin: 4.5 g/dL (ref 3.8–4.9)
Alkaline Phosphatase: 97 IU/L (ref 39–117)
BUN/Creatinine Ratio: 15 (ref 9–23)
BUN: 14 mg/dL (ref 6–24)
Bilirubin Total: 0.6 mg/dL (ref 0.0–1.2)
CO2: 21 mmol/L (ref 20–29)
Calcium: 9.8 mg/dL (ref 8.7–10.2)
Chloride: 102 mmol/L (ref 96–106)
Creatinine, Ser: 0.92 mg/dL (ref 0.57–1.00)
GFR calc Af Amer: 82 mL/min/{1.73_m2} (ref >59)
GFR calc non Af Amer: 71 mL/min/{1.73_m2} (ref >59)
Globulin, Total: 2.5 g/dL (ref 1.5–4.5)
Glucose: 92 mg/dL (ref 65–99)
Potassium: 4.8 mmol/L (ref 3.5–5.2)
Sodium: 138 mmol/L (ref 134–144)
Total Protein: 7 g/dL (ref 6.0–8.5)

## 2019-02-09 LAB — LIPID PANEL
Chol/HDL Ratio: 3.7 ratio (ref 0.0–4.4)
Cholesterol, Total: 216 mg/dL — ABNORMAL HIGH (ref 100–199)
HDL: 58 mg/dL (ref >39)
LDL Calculated: 132 mg/dL — ABNORMAL HIGH (ref 0–99)
Triglycerides: 131 mg/dL (ref 0–149)
VLDL Cholesterol Cal: 26 mg/dL (ref 5–40)

## 2019-02-09 LAB — HEMOGLOBIN A1C
Est. average glucose Bld gHb Est-mCnc: 123 mg/dL
Hgb A1c MFr Bld: 5.9 % — ABNORMAL HIGH (ref 4.8–5.6)

## 2019-02-09 LAB — TSH: TSH: 2.01 u[IU]/mL (ref 0.450–4.500)

## 2019-02-09 LAB — VITAMIN D 25 HYDROXY (VIT D DEFICIENCY, FRACTURES): Vit D, 25-Hydroxy: 31.5 ng/mL (ref 30.0–100.0)

## 2019-03-24 ENCOUNTER — Other Ambulatory Visit: Payer: Self-pay

## 2019-03-24 ENCOUNTER — Ambulatory Visit
Admission: RE | Admit: 2019-03-24 | Discharge: 2019-03-24 | Disposition: A | Discharge status: Home / Self Care | Payer: BC Managed Care – PPO | Source: Ambulatory Visit | Attending: Certified Nurse Midwife | Admitting: Certified Nurse Midwife

## 2019-03-24 DIAGNOSIS — Z1231 Encounter for screening mammogram for malignant neoplasm of breast: Secondary | ICD-10-CM

## 2019-07-22 DIAGNOSIS — Z8616 Personal history of COVID-19: Secondary | ICD-10-CM

## 2019-07-22 HISTORY — DX: Personal history of COVID-19: Z86.16

## 2019-08-05 ENCOUNTER — Other Ambulatory Visit: Payer: BC Managed Care – PPO

## 2019-10-10 ENCOUNTER — Encounter: Payer: Self-pay | Admitting: Certified Nurse Midwife

## 2020-02-08 ENCOUNTER — Ambulatory Visit: Payer: BC Managed Care – PPO | Admitting: Certified Nurse Midwife

## 2020-02-21 ENCOUNTER — Ambulatory Visit (INDEPENDENT_AMBULATORY_CARE_PROVIDER_SITE_OTHER): Payer: BC Managed Care – PPO | Admitting: Obstetrics and Gynecology

## 2020-02-21 ENCOUNTER — Other Ambulatory Visit: Payer: Self-pay

## 2020-02-21 ENCOUNTER — Encounter: Payer: Self-pay | Admitting: Obstetrics and Gynecology

## 2020-02-21 VITALS — BP 148/88 | HR 66 | Resp 14 | Ht 67.0 in | Wt 197.0 lb

## 2020-02-21 DIAGNOSIS — Z01419 Encounter for gynecological examination (general) (routine) without abnormal findings: Secondary | ICD-10-CM

## 2020-02-21 DIAGNOSIS — R7309 Other abnormal glucose: Secondary | ICD-10-CM | POA: Diagnosis not present

## 2020-02-21 NOTE — Progress Notes (Signed)
54 y.o. G37P2002 Married Caucasian female here for annual exam.    Diagnosed with COVID 07/2019 Had to do O2 therapy.  She is planning to do the vaccine.   Lost 20 pounds intentionally.  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   Working from home for [employer].   PCP:  Keturah Barre, MD--[place]   Patient's last menstrual period was 04/21/2015.           Sexually active: Yes.    The current method of family planning is post menopausal status.    Exercising: Yes.    walking Smoker:  no   Health Maintenance: Pap: 02-07-19 Neg:Neg HR HPV, 10-09-16 Neg, 10-03-14 Neg History of abnormal Pap:  no MMG: 03-24-19 3D/Neg/density B/BiRads1 Colonoscopy:  2016;next 10 years BMD:   n/a  Result  n/a TDaP:  2012 Gardasil:   no HIV: 10-04-15 NR Hep C:10-04-15 Neg Screening Labs:   Today.    reports that she has never smoked. She has never used smokeless tobacco. She reports that she does not drink alcohol and does not use drugs.  Past Medical History:  Diagnosis Date  . History of COVID-19 07/2019   developed pneumonia  . Hydrosalpinx    right    Past Surgical History:  Procedure Laterality Date  . BREAST BIOPSY  2006  . LAPAROSCOPIC CHOLECYSTECTOMY  2003    Current Outpatient Medications  Medication Sig Dispense Refill  . Ascorbic Acid (VITAMIN C PO) Take by mouth.    . Cholecalciferol (VITAMIN D3 PO) Take 2 tablets by mouth daily.    . fexofenadine (ALLEGRA) 180 MG tablet Take 180 mg by mouth daily.    . Multiple Vitamins-Minerals (MULTIVITAMIN PO) Take by mouth daily.    Marland Kitchen nystatin-triamcinolone ointment (MYCOLOG) Apply 1 application topically 2 (two) times daily. 30 g 1  . Omega-3 1000 MG CAPS Take by mouth.    . Triamcinolone Acetonide (NASACORT AQ NA) Place into the nose.    . vitamin E 45 MG (100 UNITS) capsule Take by mouth.     No current facility-administered medications for this visit.    Family History  Problem Relation Age of Onset  .  Hypertension Mother   . Kidney failure Mother   . Breast cancer Cousin     Review of Systems  All other systems reviewed and are negative.   Exam:   BP (!) 148/88 (Cuff Size: Large)   Pulse 66   Resp 14   Ht 5\' 7"  (1.702 m)   Wt 197 lb (89.4 kg)   LMP 04/21/2015   BMI 30.85 kg/m     General appearance: alert, cooperative and appears stated age Head: normocephalic, without obvious abnormality, atraumatic Neck: no adenopathy, supple, symmetrical, trachea midline and thyroid normal to inspection and palpation Lungs: clear to auscultation bilaterally Breasts: normal appearance, no masses or tenderness, No nipple retraction or dimpling, No nipple discharge or bleeding, No axillary adenopathy Heart: regular rate and rhythm Abdomen: soft, non-tender; no masses, no organomegaly Extremities: extremities normal, atraumatic, no cyanosis or edema Skin: skin color, texture, turgor normal. Sun exposure.  Some rash noted.  Lymph nodes: cervical, supraclavicular, and axillary nodes normal. Neurologic: grossly normal  Pelvic: External genitalia:  no lesions              No abnormal inguinal nodes palpated.              Urethra:  normal appearing urethra with no masses, tenderness or lesions  Bartholins and Skenes: normal                 Vagina: normal appearing vagina with normal color and discharge, no lesions              Cervix: no lesions              Pap taken: No. Bimanual Exam:  Uterus:  normal size, contour, position, consistency, mobility, non-tender              Adnexa: no mass, fullness, tenderness              Rectal exam: Yes.  .  Confirms.              Anus:  normal sphincter tone, no lesions  Chaperone was present for exam.  Assessment:   Well woman visit with normal exam. Intentional weight loss.  Hx elevated A1C.  Sun exposure.   Plan: Mammogram screening discussed. Self breast awareness reviewed. Pap and HR HPV as above. Guidelines for Calcium,  Vitamin D, regular exercise program including cardiovascular and weight bearing exercise. Check BP at home.  Routine labs including A1C.  She will contact dermatology for a skin check. Follow up annually and prn.   After visit summary provided.

## 2020-02-21 NOTE — Patient Instructions (Signed)

## 2020-02-22 LAB — LIPID PANEL
Chol/HDL Ratio: 4.3 ratio (ref 0.0–4.4)
Cholesterol, Total: 226 mg/dL — ABNORMAL HIGH (ref 100–199)
HDL: 53 mg/dL (ref >39)
LDL Chol Calc (NIH): 148 mg/dL — ABNORMAL HIGH (ref 0–99)
Triglycerides: 137 mg/dL (ref 0–149)
VLDL Cholesterol Cal: 25 mg/dL (ref 5–40)

## 2020-02-22 LAB — COMPREHENSIVE METABOLIC PANEL
ALT: 8 IU/L (ref 0–32)
AST: 19 IU/L (ref 0–40)
Albumin/Globulin Ratio: 1.8 (ref 1.2–2.2)
Albumin: 4.6 g/dL (ref 3.8–4.9)
Alkaline Phosphatase: 110 IU/L (ref 48–121)
BUN/Creatinine Ratio: 21 (ref 9–23)
BUN: 16 mg/dL (ref 6–24)
Bilirubin Total: 0.7 mg/dL (ref 0.0–1.2)
CO2: 25 mmol/L (ref 20–29)
Calcium: 9.6 mg/dL (ref 8.7–10.2)
Chloride: 102 mmol/L (ref 96–106)
Creatinine, Ser: 0.75 mg/dL (ref 0.57–1.00)
GFR calc Af Amer: 104 mL/min/{1.73_m2} (ref >59)
GFR calc non Af Amer: 90 mL/min/{1.73_m2} (ref >59)
Globulin, Total: 2.6 g/dL (ref 1.5–4.5)
Glucose: 94 mg/dL (ref 65–99)
Potassium: 4.6 mmol/L (ref 3.5–5.2)
Sodium: 139 mmol/L (ref 134–144)
Total Protein: 7.2 g/dL (ref 6.0–8.5)

## 2020-02-22 LAB — CBC
Hematocrit: 40.5 % (ref 34.0–46.6)
Hemoglobin: 13.6 g/dL (ref 11.1–15.9)
MCH: 29.2 pg (ref 26.6–33.0)
MCHC: 33.6 g/dL (ref 31.5–35.7)
MCV: 87 fL (ref 79–97)
Platelets: 344 10*3/uL (ref 150–450)
RBC: 4.66 x10E6/uL (ref 3.77–5.28)
RDW: 12.9 % (ref 11.7–15.4)
WBC: 6.6 10*3/uL (ref 3.4–10.8)

## 2020-02-22 LAB — HEMOGLOBIN A1C
Est. average glucose Bld gHb Est-mCnc: 123 mg/dL
Hgb A1c MFr Bld: 5.9 % — ABNORMAL HIGH (ref 4.8–5.6)

## 2020-02-22 LAB — VITAMIN D 25 HYDROXY (VIT D DEFICIENCY, FRACTURES): Vit D, 25-Hydroxy: 47.6 ng/mL (ref 30.0–100.0)

## 2020-02-22 LAB — TSH: TSH: 2.01 u[IU]/mL (ref 0.450–4.500)

## 2020-09-17 ENCOUNTER — Other Ambulatory Visit: Payer: Self-pay | Admitting: Physician Assistant

## 2020-09-17 ENCOUNTER — Other Ambulatory Visit: Payer: Self-pay | Admitting: Obstetrics and Gynecology

## 2020-09-17 DIAGNOSIS — Z0000 Encounter for general adult medical examination without abnormal findings: Secondary | ICD-10-CM

## 2020-11-09 ENCOUNTER — Other Ambulatory Visit: Payer: Self-pay

## 2020-11-09 ENCOUNTER — Ambulatory Visit
Admission: RE | Admit: 2020-11-09 | Discharge: 2020-11-09 | Disposition: A | Discharge status: Home / Self Care | Payer: BC Managed Care – PPO | Source: Ambulatory Visit | Attending: Obstetrics and Gynecology | Admitting: Obstetrics and Gynecology

## 2020-11-09 DIAGNOSIS — Z0000 Encounter for general adult medical examination without abnormal findings: Secondary | ICD-10-CM

## 2021-02-25 ENCOUNTER — Ambulatory Visit: Payer: BC Managed Care – PPO | Admitting: Obstetrics and Gynecology

## 2021-03-27 ENCOUNTER — Ambulatory Visit: Payer: BC Managed Care – PPO | Admitting: Obstetrics and Gynecology

## 2021-05-02 ENCOUNTER — Ambulatory Visit: Payer: BC Managed Care – PPO | Admitting: Obstetrics and Gynecology

## 2021-06-20 NOTE — Progress Notes (Deleted)
55 y.o. G51P2002 Married Caucasian female here for annual exam.    PCP: Dina Rich, MD     Patient's last menstrual period was 04/21/2015.           Sexually active: {yes no:314532}  The current method of family planning is post menopausal status.    Exercising: {yes no:314532}  {types:19826} Smoker:  {YES NO:22349}  Health Maintenance: Pap: 02-07-19 Neg:Neg HR HPV, 10-09-16 Neg, 10-03-14 Neg History of abnormal Pap:  no MMG: 11/09/20- neg birads 1  Colonoscopy:  2016; F/u in 10 yrs BMD:   n/a  Result  n/a TDaP:  2012 Gardasil:   no HIV: 10/04/15- NR Hep C: 10/04/15- neg  Screening Labs:  Hb today: ***, Urine today: ***   reports that she has never smoked. She has never used smokeless tobacco. She reports that she does not drink alcohol and does not use drugs.  Past Medical History:  Diagnosis Date   Elevated hemoglobin A1c    History of COVID-19 07/2019   developed pneumonia   Hydrosalpinx    right    Past Surgical History:  Procedure Laterality Date   BREAST BIOPSY  2006   LAPAROSCOPIC CHOLECYSTECTOMY  2003    Current Outpatient Medications  Medication Sig Dispense Refill   Ascorbic Acid (VITAMIN C PO) Take by mouth.     Cholecalciferol (VITAMIN D3 PO) Take 2 tablets by mouth daily.     fexofenadine (ALLEGRA) 180 MG tablet Take 180 mg by mouth daily.     Multiple Vitamins-Minerals (MULTIVITAMIN PO) Take by mouth daily.     nystatin-triamcinolone ointment (MYCOLOG) Apply 1 application topically 2 (two) times daily. 30 g 1   Omega-3 1000 MG CAPS Take by mouth.     Triamcinolone Acetonide (NASACORT AQ NA) Place into the nose.     vitamin E 45 MG (100 UNITS) capsule Take by mouth.     No current facility-administered medications for this visit.    Family History  Problem Relation Age of Onset   Hypertension Mother    Kidney failure Mother    Breast cancer Cousin     Review of Systems  Exam:   LMP 04/21/2015     General appearance: alert, cooperative and  appears stated age Head: normocephalic, without obvious abnormality, atraumatic Neck: no adenopathy, supple, symmetrical, trachea midline and thyroid normal to inspection and palpation Lungs: clear to auscultation bilaterally Breasts: normal appearance, no masses or tenderness, No nipple retraction or dimpling, No nipple discharge or bleeding, No axillary adenopathy Heart: regular rate and rhythm Abdomen: soft, non-tender; no masses, no organomegaly Extremities: extremities normal, atraumatic, no cyanosis or edema Skin: skin color, texture, turgor normal. No rashes or lesions Lymph nodes: cervical, supraclavicular, and axillary nodes normal. Neurologic: grossly normal  Pelvic: External genitalia:  no lesions              No abnormal inguinal nodes palpated.              Urethra:  normal appearing urethra with no masses, tenderness or lesions              Bartholins and Skenes: normal                 Vagina: normal appearing vagina with normal color and discharge, no lesions              Cervix: no lesions              Pap taken: {yes no:314532} Bimanual Exam:  Uterus:  normal size, contour, position, consistency, mobility, non-tender              Adnexa: no mass, fullness, tenderness              Rectal exam: {yes no:314532}.  Confirms.              Anus:  normal sphincter tone, no lesions  Chaperone was present for exam:  ***  Assessment:   Well woman visit with gynecologic exam.   Plan: Mammogram screening discussed. Self breast awareness reviewed. Pap and HR HPV as above. Guidelines for Calcium, Vitamin D, regular exercise program including cardiovascular and weight bearing exercise.   Follow up annually and prn.   Additional counseling given.  {yes [id number]. _______ minutes face to face time of which over 50% was spent in counseling.    After visit summary provided.

## 2021-06-28 ENCOUNTER — Ambulatory Visit: Payer: BC Managed Care – PPO | Admitting: Obstetrics and Gynecology

## 2021-09-09 NOTE — Progress Notes (Signed)
56 y.o. G33P2002 Married Caucasian female here for annual exam.    Mother passed 2022.   PCP: Keturah Barre, MD    Patient's last menstrual period was 04/21/2015.           Sexually active: Yes.   occ The current method of family planning is post menopausal status.    Exercising: Yes.     Walking when she can Smoker:  no  Health Maintenance: Pap:  02-07-19 Neg:Neg HR HPV, 10-09-16 Neg, 10-03-14 Neg History of abnormal Pap:  no MMG:  11-09-20 Neg/BiRads1 Colonoscopy:   2016;next 10 years BMD:   n/a  Result  n/a TDaP:  2012--will do with PCP Gardasil:   no HIV: 10-04-15 NR Hep C: 10-04-15 Neg Screening Labs:  PCP. Flu vaccine:  does not do.  Covid vaccine:  booster x 1.    reports that she has never smoked. She has never used smokeless tobacco. She reports that she does not drink alcohol and does not use drugs.  Past Medical History:  Diagnosis Date   Elevated hemoglobin A1c    History of COVID-19 07/2019   developed pneumonia   Hydrosalpinx    right    Past Surgical History:  Procedure Laterality Date   BREAST BIOPSY  2006   LAPAROSCOPIC CHOLECYSTECTOMY  2003    Current Outpatient Medications  Medication Sig Dispense Refill   Ascorbic Acid (VITAMIN C PO) Take by mouth.     Black Elderberry (SAMBUCUS ELDERBERRY PO) Take 1 capsule by mouth daily.     Cholecalciferol (VITAMIN D3 PO) Take 2 tablets by mouth daily.     Collagen Hydrolysate POWD by Does not apply route.     fexofenadine (ALLEGRA) 180 MG tablet Take 180 mg by mouth daily.     Multiple Vitamins-Minerals (MULTIVITAMIN PO) Take by mouth daily.     nystatin-triamcinolone ointment (MYCOLOG) Apply 1 application topically 2 (two) times daily. 30 g 1   Omega-3 1000 MG CAPS Take by mouth.     Triamcinolone Acetonide (NASACORT AQ NA) Place into the nose.     vitamin E 45 MG (100 UNITS) capsule Take by mouth.     No current facility-administered medications for this visit.    Family History  Problem Relation Age  of Onset   Hypertension Mother    Kidney failure Mother    Breast cancer Cousin     Review of Systems  All other systems reviewed and are negative.  Exam:   BP (!) 144/82    Pulse 90    Ht 5' 7.5" (1.715 m)    Wt 198 lb (89.8 kg)    LMP 04/21/2015    SpO2 97%    BMI 30.55 kg/m     General appearance: alert, cooperative and appears stated age Head: normocephalic, without obvious abnormality, atraumatic Neck: no adenopathy, supple, symmetrical, trachea midline and thyroid normal to inspection and palpation Lungs: clear to auscultation bilaterally Breasts: normal appearance, no masses or tenderness, No nipple retraction or dimpling, No nipple discharge or bleeding, No axillary adenopathy Heart: regular rate and rhythm Abdomen: soft, non-tender; no masses, no organomegaly Extremities: extremities normal, atraumatic, no cyanosis or edema Skin: skin color, texture, turgor normal. No rashes or lesions Lymph nodes: cervical, supraclavicular, and axillary nodes normal. Neurologic: grossly normal  Pelvic: External genitalia:  no lesions              No abnormal inguinal nodes palpated.  Urethra:  normal appearing urethra with no masses, tenderness or lesions              Bartholins and Skenes: normal                 Vagina: normal appearing vagina with normal color and discharge, no lesions              Cervix: no lesions              Pap taken: no Bimanual Exam:  Uterus:  normal size, contour, position, consistency, mobility, non-tender              Adnexa: no mass, fullness, tenderness              Rectal exam: yes.  Confirms.              Anus:  normal sphincter tone, no lesions  Chaperone was present for exam:  yes.  Assessment:   Well woman visit with gynecologic exam.   Plan: Mammogram screening discussed. Self breast awareness reviewed. Pap and HR HPV 2025. Guidelines for Calcium, Vitamin D, regular exercise program including cardiovascular and weight bearing  exercise. Routine labs and TDap with PCP. Bone density around age 31 yo. Follow up annually and prn.   After visit summary provided.

## 2021-09-10 ENCOUNTER — Ambulatory Visit (INDEPENDENT_AMBULATORY_CARE_PROVIDER_SITE_OTHER): Payer: BC Managed Care – PPO | Admitting: Obstetrics and Gynecology

## 2021-09-10 ENCOUNTER — Ambulatory Visit: Payer: BC Managed Care – PPO | Admitting: Obstetrics and Gynecology

## 2021-09-10 ENCOUNTER — Encounter: Payer: Self-pay | Admitting: Obstetrics and Gynecology

## 2021-09-10 ENCOUNTER — Other Ambulatory Visit: Payer: Self-pay

## 2021-09-10 VITALS — BP 144/82 | HR 90 | Ht 67.5 in | Wt 198.0 lb

## 2021-09-10 DIAGNOSIS — Z01419 Encounter for gynecological examination (general) (routine) without abnormal findings: Secondary | ICD-10-CM

## 2021-09-10 NOTE — Patient Instructions (Signed)

## 2022-03-25 IMAGING — MG MM DIGITAL SCREENING BILAT W/ TOMO AND CAD
8 series · 9 of 24 positions shown · non-contrast
Comparison: Previous exam(s).

CLINICAL DATA: Screening.

EXAM:
DIGITAL SCREENING BILATERAL MAMMOGRAM WITH TOMOSYNTHESIS AND CAD
TECHNIQUE: Bilateral screening digital craniocaudal and mediolateral oblique
mammograms were obtained. Bilateral screening digital breast
tomosynthesis was performed. The images were evaluated with
computer-aided detection.

[L CC synth-2D]
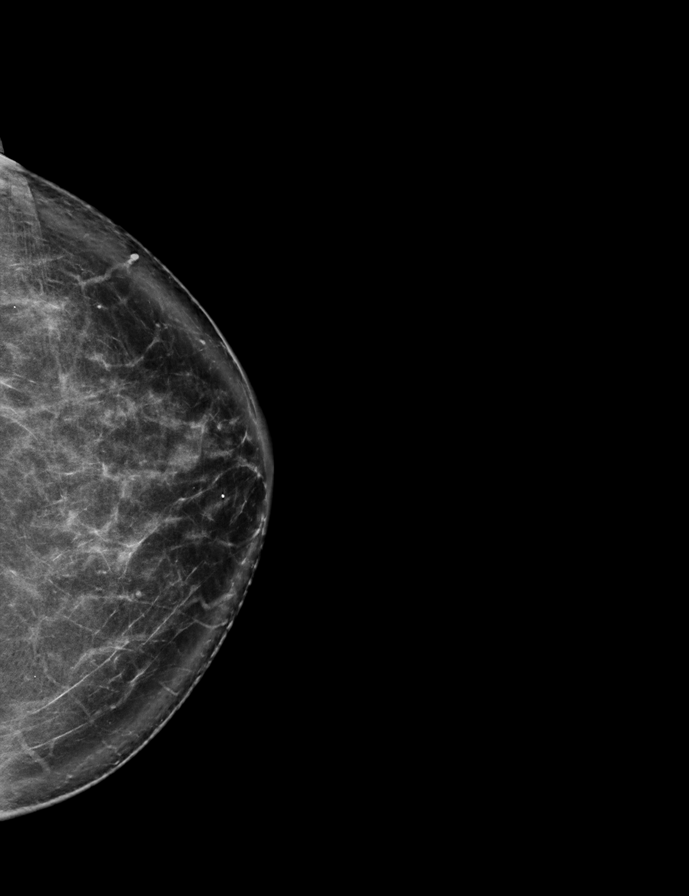

[R MLO synth-2D]
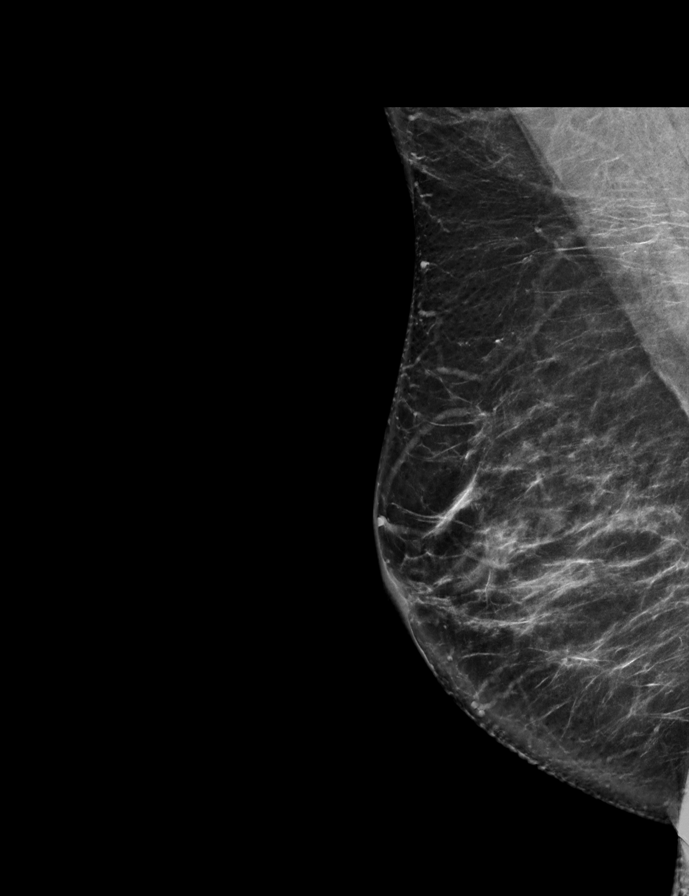

[L MLO synth-2D]
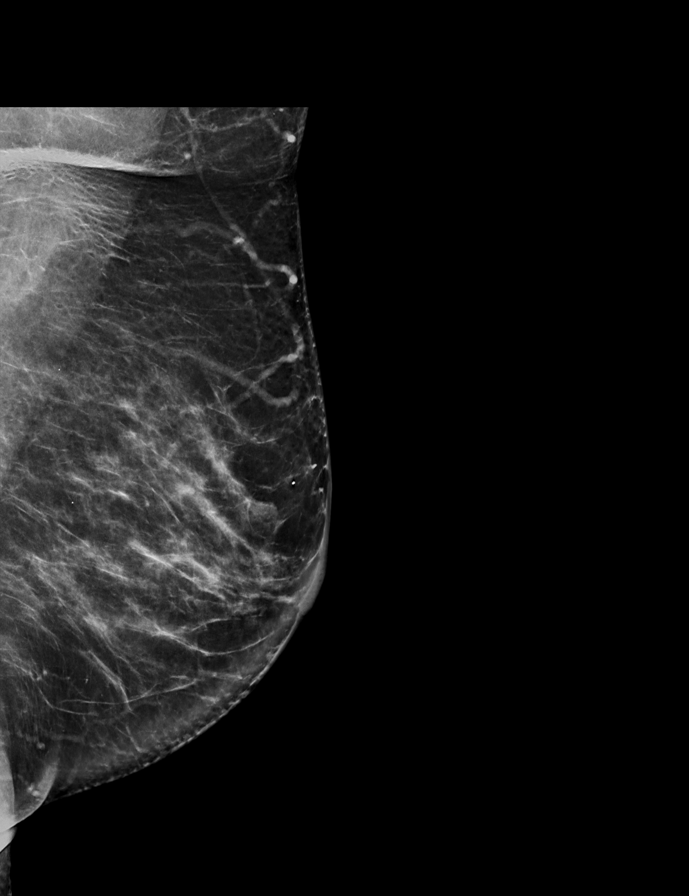

[R CC synth-2D]
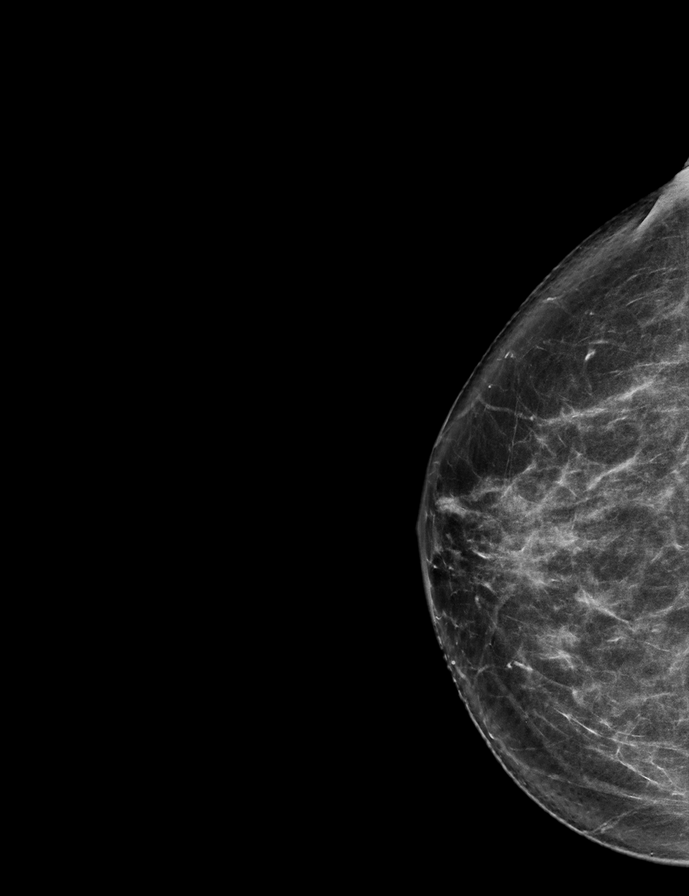

[R MLO tomo · 2 of 81 frames shown]
[frame 27/81]
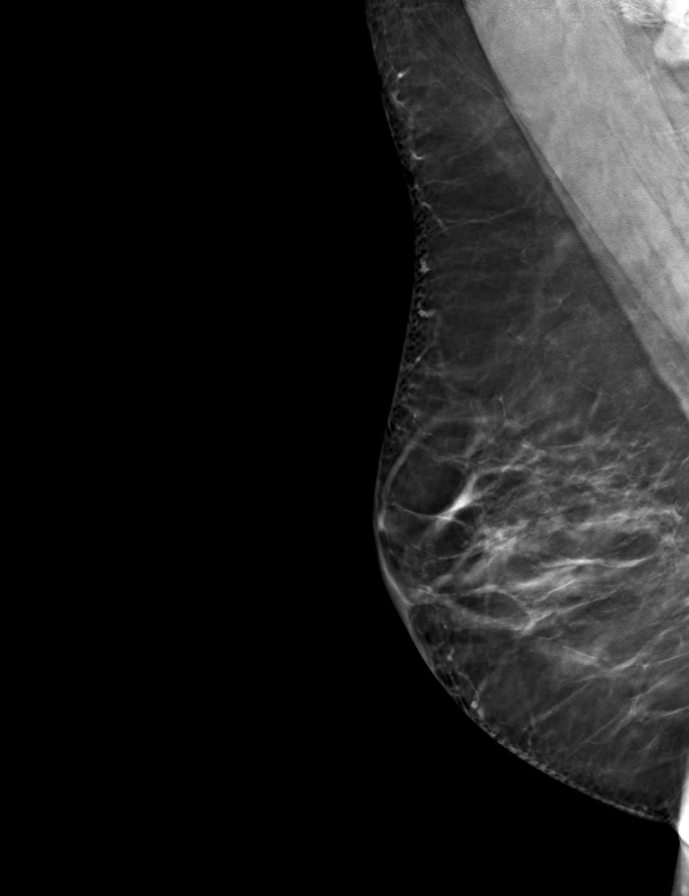
[frame 41/81]
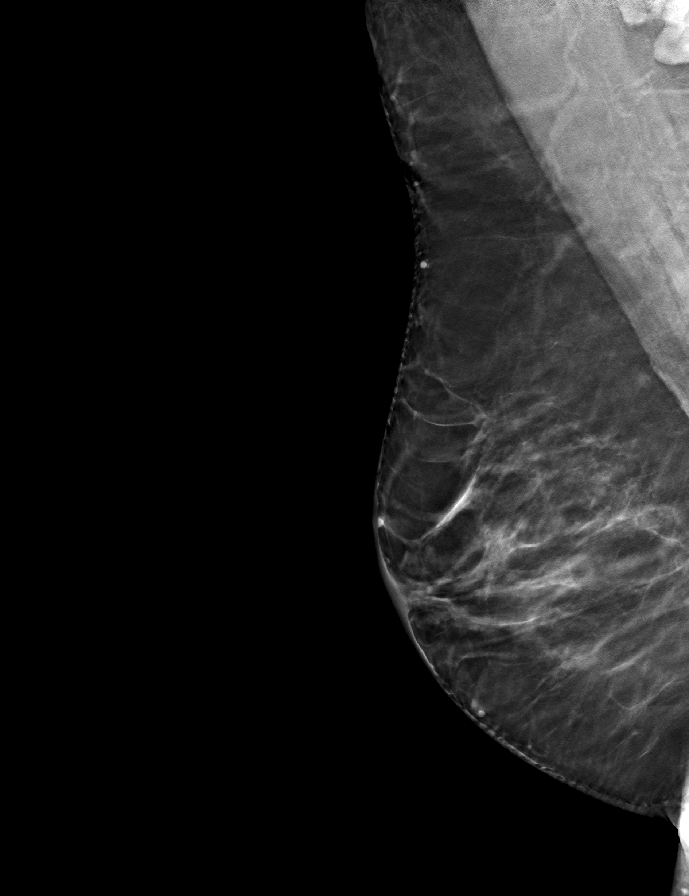

[L MLO tomo · tomo slice 43/85.0]
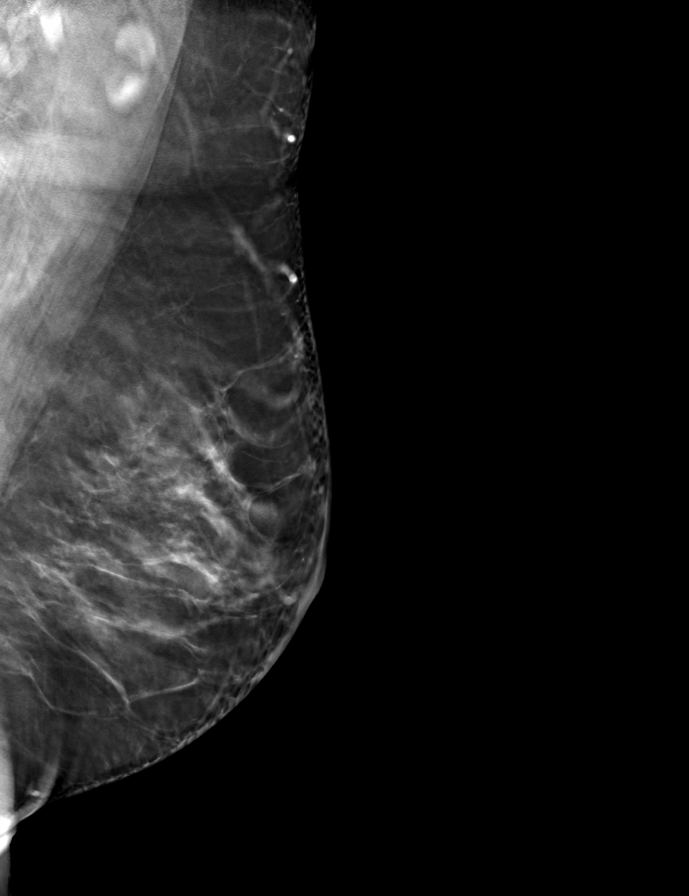

[L CC tomo · tomo slice 45/90.0]
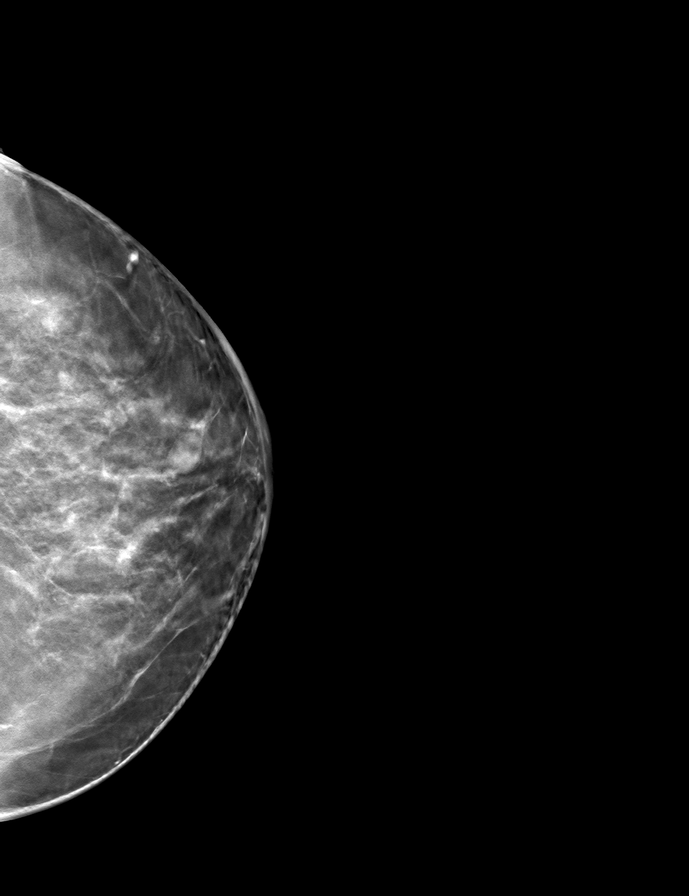

[R CC tomo · tomo slice 37/74.0]
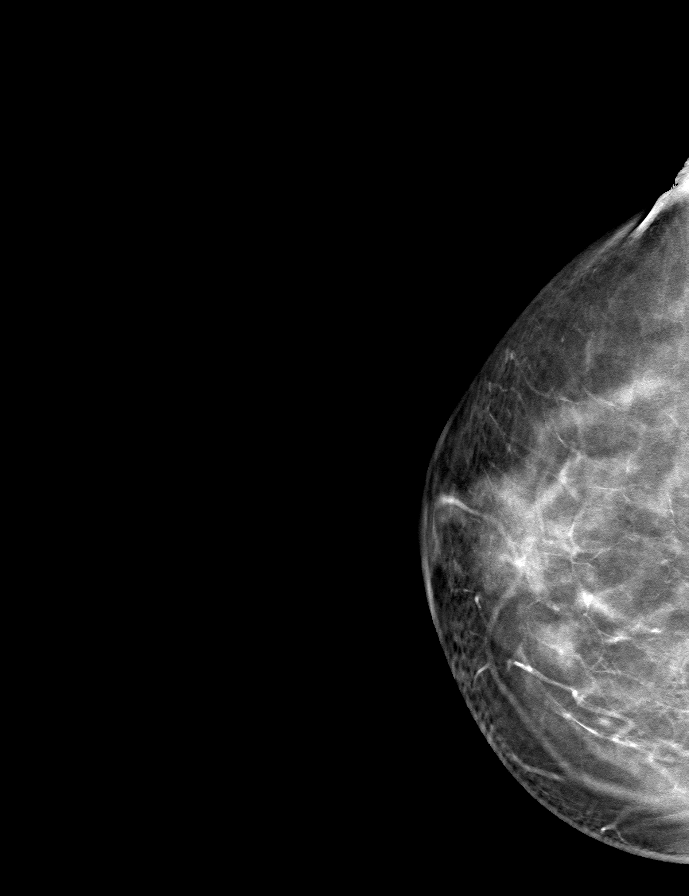

[9 of 24 positions shown; findings below may reference images not displayed]

ACR Breast Density Category b: There are scattered areas of
fibroglandular density.
FINDINGS: There are no findings suspicious for malignancy. The images were
evaluated with computer-aided detection.
IMPRESSION: No mammographic evidence of malignancy. A result letter of this
screening mammogram will be mailed directly to the patient.

RECOMMENDATION:
Screening mammogram in one year. (Code:WJ-[id number])

BI-RADS CATEGORY  1: Negative.

## 2022-11-17 ENCOUNTER — Other Ambulatory Visit: Payer: Self-pay | Admitting: Obstetrics and Gynecology

## 2022-11-17 DIAGNOSIS — Z1231 Encounter for screening mammogram for malignant neoplasm of breast: Secondary | ICD-10-CM

## 2022-12-18 ENCOUNTER — Ambulatory Visit
Admission: RE | Admit: 2022-12-18 | Discharge: 2022-12-18 | Disposition: A | Discharge status: Home / Self Care | Payer: BC Managed Care – PPO | Source: Ambulatory Visit | Attending: Obstetrics and Gynecology | Admitting: Obstetrics and Gynecology

## 2022-12-18 DIAGNOSIS — Z1231 Encounter for screening mammogram for malignant neoplasm of breast: Secondary | ICD-10-CM

## 2023-07-06 NOTE — Progress Notes (Signed)
57 y.o. G34P2002 Married Caucasian female here for annual exam.    Has an occasional UTI, one or two per year.   Wants to start using the treadmill again.   PCP: Hadley Pen, MD   Patient's last menstrual period was 04/21/2015.           Sexually active: Yes.    The current method of family planning is post menopausal status.    Menopausal hormone therapy:  n/a Exercising: Yes.     walking Smoker:  no  OB History  Gravida Para Term Preterm AB Living  2 2 2   2   SAB IAB Ectopic Multiple Live Births      2    # Outcome Date GA Lbr Len/2nd Weight Sex Type Anes PTL Lv  2 Term     M Vag-Spont   LIV  1 Term     F Vag-Spont   LIV     HEALTH MAINTENANCE: Last 2 paps:  02/07/19 neg: HR HPV neg, 10/09/16 neg History of abnormal Pap or positive HPV:  no Mammogram:   12/18/22 Breast Density Cat B, BI-RADS CAT 1 neg Colonoscopy:  12/04/14 - due in 2026.  Bone Density:  n/a  Result  n/a   Immunization History  Administered Date(s) Administered   Moderna Sars-Covid-2 Vaccination 03/30/2020, 09/24/2020   Tdap 08/21/2010      reports that she has never smoked. She has never used smokeless tobacco. She reports that she does not drink alcohol and does not use drugs.  Past Medical History:  Diagnosis Date   Elevated hemoglobin A1c    History of COVID-19 07/2019   developed pneumonia   Hydrosalpinx    right    Past Surgical History:  Procedure Laterality Date   BREAST BIOPSY  2006   LAPAROSCOPIC CHOLECYSTECTOMY  2003    Current Outpatient Medications  Medication Sig Dispense Refill   fexofenadine (ALLEGRA) 180 MG tablet Take 180 mg by mouth daily.     Multiple Vitamins-Minerals (MULTIVITAMIN PO) Take by mouth daily.     Omega-3 1000 MG CAPS Take by mouth.     vitamin E 45 MG (100 UNITS) capsule Take by mouth.     No current facility-administered medications for this visit.    ALLERGIES: Patient has no known allergies.  Family History  Problem Relation Age of  Onset   Hypertension Mother    Kidney failure Mother    Breast cancer Cousin     Review of Systems  All other systems reviewed and are negative.   PHYSICAL EXAM:  BP 136/84 (BP Location: Left Arm, Patient Position: Sitting, Cuff Size: Small)   Pulse 80   Ht 5' 7.5" (1.715 m)   Wt 206 lb (93.4 kg)   LMP 04/21/2015   SpO2 97%   BMI 31.79 kg/m     General appearance: alert, cooperative and appears stated age Head: normocephalic, without obvious abnormality, atraumatic Neck: no adenopathy, supple, symmetrical, trachea midline and thyroid normal to inspection and palpation Lungs: clear to auscultation bilaterally Breasts: normal appearance, no masses or tenderness, No nipple retraction or dimpling, No nipple discharge or bleeding, No axillary adenopathy Heart: regular rate and rhythm Abdomen: soft, non-tender; no masses, no organomegaly Extremities: extremities normal, atraumatic, no cyanosis or edema Skin: skin color, texture, turgor normal. No rashes or lesions Lymph nodes: cervical, supraclavicular, and axillary nodes normal. Neurologic: grossly normal  Pelvic: External genitalia:  no lesions  No abnormal inguinal nodes palpated.              Urethra:  normal appearing urethra with no masses, tenderness or lesions              Bartholins and Skenes: normal                 Vagina: normal appearing vagina with normal color and discharge, no lesions.  Atrophy noted.               Cervix: no lesions              Pap taken: No. Bimanual Exam:  Uterus:  normal size, contour, position, consistency, mobility, non-tender              Adnexa: no mass, fullness, tenderness              Rectal exam: Yes.  .  Confirms.              Anus:  normal sphincter tone, no lesions  Chaperone was present for exam:  Warren Lacy, CMA  ASSESSMENT: Well woman visit with gynecologic exam Hx postmenopausal UTIs.   PLAN: Mammogram screening discussed. Self breast awareness reviewed. Pap  and HRV collected:  no.  Due in 2026.  Guidelines for Calcium, Vitamin D, regular exercise program including cardiovascular and weight bearing exercise. Medication refills:  NA. Rx for Vaginal estradiol cream.  Instructed in use.  I discussed potential effect on breast cancer.  Follow up:  1 year and prn.

## 2023-07-20 ENCOUNTER — Encounter: Payer: Self-pay | Admitting: Obstetrics and Gynecology

## 2023-07-20 ENCOUNTER — Ambulatory Visit (INDEPENDENT_AMBULATORY_CARE_PROVIDER_SITE_OTHER): Payer: BC Managed Care – PPO | Admitting: Obstetrics and Gynecology

## 2023-07-20 VITALS — BP 136/84 | HR 80 | Ht 67.5 in | Wt 206.0 lb

## 2023-07-20 DIAGNOSIS — Z01419 Encounter for gynecological examination (general) (routine) without abnormal findings: Secondary | ICD-10-CM

## 2023-07-20 MED ORDER — ESTRADIOL 0.1 MG/GM VA CREA: TOPICAL_CREAM | VAGINAL | 2 refills | Status: DC

## 2023-07-20 NOTE — Patient Instructions (Signed)

## 2024-04-28 ENCOUNTER — Other Ambulatory Visit: Payer: Self-pay | Admitting: Obstetrics and Gynecology

## 2024-04-28 DIAGNOSIS — Z1231 Encounter for screening mammogram for malignant neoplasm of breast: Secondary | ICD-10-CM

## 2024-05-13 ENCOUNTER — Ambulatory Visit
Admission: RE | Admit: 2024-05-13 | Discharge: 2024-05-13 | Disposition: A | Discharge status: Home / Self Care | Source: Ambulatory Visit | Attending: Obstetrics and Gynecology | Admitting: Obstetrics and Gynecology

## 2024-05-13 DIAGNOSIS — Z1231 Encounter for screening mammogram for malignant neoplasm of breast: Secondary | ICD-10-CM

## 2024-05-19 ENCOUNTER — Ambulatory Visit: Payer: Self-pay | Admitting: Obstetrics and Gynecology

## 2024-07-22 NOTE — Progress Notes (Signed)
 "  59 y.o. G53P2002 Married Caucasian female here for annual exam.    Not having UTIs.  Not using vaginal estrogen cream.   Has elevated A1C.  Taking Lipitor.  Elevated potassium following beet supplement and taking Meloxicam.   Episodes of tachycardia.  Wore a monitor and no findings per patient.   PCP: Silver Lamar LABOR, MD   Patient's last menstrual period was 04/21/2015.           Sexually active: Yes.    The current method of family planning is post menopausal status.    Menopausal hormone therapy:  n/a  Exercising: No.   Smoker:  no  OB History  Gravida Para Term Preterm AB Living  2 2 2   2   SAB IAB Ectopic Multiple Live Births      2    # Outcome Date GA Lbr Len/2nd Weight Sex Type Anes PTL Lv  2 Term     M Vag-Spont   LIV  1 Term     F Vag-Spont   LIV     HEALTH MAINTENANCE: Last 2 paps:  02/07/19 neg HPV neg, 10/09/16 neg  History of abnormal Pap or positive HPV:  no Mammogram:   05/13/24 Breast Density Cat B, BIRADS Cat 1 neg  Colonoscopy:  12/04/14 - has appointment in May in [place].  Bone Density:  n/a  Result  n/a   Immunization History  Administered Date(s) Administered   Moderna Sars-Covid-2 Vaccination 02/29/2020, 03/30/2020, 09/24/2020   Tdap 08/21/2010      reports that she has never smoked. She has never used smokeless tobacco. She reports that she does not drink alcohol and does not use drugs.  Past Medical History:  Diagnosis Date   Elevated hemoglobin A1c    History of COVID-19 07/2019   developed pneumonia   Hydrosalpinx    right    Past Surgical History:  Procedure Laterality Date   BREAST BIOPSY  2006   LAPAROSCOPIC CHOLECYSTECTOMY  2003    Current Outpatient Medications  Medication Sig Dispense Refill   atorvastatin (LIPITOR) 10 MG tablet Take 10 mg by mouth at bedtime.     fexofenadine (ALLEGRA) 180 MG tablet Take 180 mg by mouth daily.     Multiple Vitamins-Minerals (MULTIVITAMIN PO) Take by mouth daily.     Omega-3 1000  MG CAPS Take by mouth.     vitamin E 45 MG (100 UNITS) capsule Take by mouth.     estradiol  (ESTRACE ) 0.1 MG/GM vaginal cream Use 1/2 g vaginally and a pea size amount to the urethra every night for the first 2 weeks, then use 1/2 g vaginally and a pea size amount to the urethra two or three times per week as needed to maintain symptom relief. (Patient not taking: Reported on 07/25/2024) 42.5 g 2   No current facility-administered medications for this visit.    ALLERGIES: Patient has no known allergies.  Family History  Problem Relation Age of Onset   Hypertension Mother    Kidney failure Mother    Breast cancer Cousin     Review of Systems  All other systems reviewed and are negative.   PHYSICAL EXAM:  BP 126/84 (BP Location: Left Arm, Patient Position: Sitting)   Pulse 82   Ht 5' 8.75 (1.746 m)   Wt 212 lb (96.2 kg)   LMP 04/21/2015   SpO2 98%   BMI 31.54 kg/m     General appearance: alert, cooperative and appears stated age Head: normocephalic, without  obvious abnormality, atraumatic Neck: no adenopathy, supple, symmetrical, trachea midline and thyroid normal to inspection and palpation Lungs: clear to auscultation bilaterally Breasts: normal appearance, no masses or tenderness, No nipple retraction or dimpling, No nipple discharge or bleeding, No axillary adenopathy Heart: regular rate and rhythm Abdomen: soft, non-tender; no masses, no organomegaly Extremities: extremities normal, atraumatic, no cyanosis or edema Skin: skin color, texture, turgor normal. No rashes or lesions.  Freckles noted.  Lymph nodes: cervical, supraclavicular, and axillary nodes normal. Neurologic: grossly normal  Pelvic: External genitalia:  no lesions              No abnormal inguinal nodes palpated.              Urethra:  normal appearing urethra with no masses, tenderness or lesions              Bartholins and Skenes: normal                 Vagina: normal appearing vagina with normal color  and discharge, no lesions              Cervix: no lesions              Pap taken: yes Bimanual Exam:  Uterus:  normal size, contour, position, consistency, mobility, non-tender              Adnexa: no mass, fullness, tenderness              Rectal exam: yes.  Confirms.              Anus:  normal sphincter tone, no lesions  Chaperone was present for exam:  Kari HERO, CMA  ASSESSMENT: Well woman visit with gynecologic exam. Cervical cancer screening.  Hx postmenopausal UTIs. PHQ-2-9: 0  PLAN: Mammogram screening discussed. Self breast awareness reviewed. Pap and HRV collected:  yes Guidelines for Calcium, Vitamin D , regular exercise program including cardiovascular and weight bearing exercise. Medication refills:  Estradiol  cream 1/2 gm pv at peas size amount to urethral 2 - 3 times per week.  Labs with PCP.  We discussed her establishing care with dermatology for skin checks.  Follow up:  yearly and prn.            "

## 2024-07-25 ENCOUNTER — Ambulatory Visit: Payer: BC Managed Care – PPO | Admitting: Obstetrics and Gynecology

## 2024-07-25 ENCOUNTER — Other Ambulatory Visit (HOSPITAL_COMMUNITY)
Admission: RE | Admit: 2024-07-25 | Discharge: 2024-07-25 | Disposition: A | Source: Ambulatory Visit | Attending: Obstetrics and Gynecology | Admitting: Obstetrics and Gynecology

## 2024-07-25 ENCOUNTER — Encounter: Payer: Self-pay | Admitting: Obstetrics and Gynecology

## 2024-07-25 VITALS — BP 126/84 | HR 82 | Ht 68.75 in | Wt 212.0 lb

## 2024-07-25 DIAGNOSIS — Z1331 Encounter for screening for depression: Secondary | ICD-10-CM | POA: Diagnosis not present

## 2024-07-25 DIAGNOSIS — Z01419 Encounter for gynecological examination (general) (routine) without abnormal findings: Secondary | ICD-10-CM | POA: Diagnosis not present

## 2024-07-25 DIAGNOSIS — Z124 Encounter for screening for malignant neoplasm of cervix: Secondary | ICD-10-CM | POA: Diagnosis present

## 2024-07-25 MED ORDER — ESTRADIOL 0.01 % VA CREA: TOPICAL_CREAM | VAGINAL | 0 refills | Status: AC

## 2024-07-25 NOTE — Patient Instructions (Signed)

## 2024-07-28 LAB — CYTOLOGY - PAP
Comment: NEGATIVE
Diagnosis: NEGATIVE
High risk HPV: NEGATIVE

## 2024-07-29 ENCOUNTER — Ambulatory Visit: Payer: Self-pay | Admitting: Obstetrics and Gynecology

## 2025-07-26 ENCOUNTER — Ambulatory Visit: Admitting: Obstetrics and Gynecology
